# Patient Record
Sex: Female | Born: 1950 | Race: White | Hispanic: No | Smoking: Former smoker
Health system: Southern US, Community
[De-identification: ages and names within clinical notes are randomized; demographics above are authoritative.]

## PROBLEM LIST (undated history)

## (undated) DIAGNOSIS — M549 Dorsalgia, unspecified: Secondary | ICD-10-CM

## (undated) DIAGNOSIS — I2089 Other forms of angina pectoris: Secondary | ICD-10-CM

## (undated) DIAGNOSIS — C4491 Basal cell carcinoma of skin, unspecified: Secondary | ICD-10-CM

## (undated) DIAGNOSIS — C8590 Non-Hodgkin lymphoma, unspecified, unspecified site: Secondary | ICD-10-CM

## (undated) DIAGNOSIS — C8190 Hodgkin lymphoma, unspecified, unspecified site: Secondary | ICD-10-CM

## (undated) DIAGNOSIS — I10 Essential (primary) hypertension: Secondary | ICD-10-CM

## (undated) DIAGNOSIS — I208 Other forms of angina pectoris: Secondary | ICD-10-CM

## (undated) DIAGNOSIS — J449 Chronic obstructive pulmonary disease, unspecified: Secondary | ICD-10-CM

## (undated) HISTORY — DX: Basal cell carcinoma of skin, unspecified: C44.91

## (undated) HISTORY — PX: MOHS SURGERY: SUR867

## (undated) HISTORY — DX: Other forms of angina pectoris: I20.89

## (undated) HISTORY — DX: Chronic obstructive pulmonary disease, unspecified: J44.9

## (undated) HISTORY — DX: Other forms of angina pectoris: I20.8

---

## 1998-06-15 HISTORY — PX: PORTACATH PLACEMENT: SHX2246

## 2004-09-03 ENCOUNTER — Emergency Department: Payer: Self-pay | Admitting: Emergency Medicine

## 2005-01-27 ENCOUNTER — Ambulatory Visit: Payer: Self-pay | Admitting: Surgery

## 2005-02-04 ENCOUNTER — Ambulatory Visit: Payer: Self-pay | Admitting: Surgery

## 2007-02-22 ENCOUNTER — Emergency Department: Payer: Self-pay | Admitting: Emergency Medicine

## 2007-02-24 ENCOUNTER — Ambulatory Visit: Payer: Self-pay | Admitting: Urology

## 2007-02-28 ENCOUNTER — Ambulatory Visit: Payer: Self-pay | Admitting: Emergency Medicine

## 2007-03-04 ENCOUNTER — Ambulatory Visit: Payer: Self-pay | Admitting: Urology

## 2007-08-10 ENCOUNTER — Ambulatory Visit: Payer: Self-pay | Admitting: Internal Medicine

## 2014-11-26 DIAGNOSIS — M25462 Effusion, left knee: Secondary | ICD-10-CM | POA: Diagnosis not present

## 2014-11-26 DIAGNOSIS — M25562 Pain in left knee: Secondary | ICD-10-CM | POA: Diagnosis not present

## 2014-11-26 DIAGNOSIS — M79605 Pain in left leg: Secondary | ICD-10-CM | POA: Diagnosis not present

## 2014-12-12 DIAGNOSIS — M25761 Osteophyte, right knee: Secondary | ICD-10-CM | POA: Diagnosis not present

## 2014-12-12 DIAGNOSIS — M25562 Pain in left knee: Secondary | ICD-10-CM | POA: Diagnosis not present

## 2014-12-12 DIAGNOSIS — M1712 Unilateral primary osteoarthritis, left knee: Secondary | ICD-10-CM | POA: Diagnosis not present

## 2015-10-10 DIAGNOSIS — B029 Zoster without complications: Secondary | ICD-10-CM | POA: Diagnosis not present

## 2015-10-10 DIAGNOSIS — N390 Urinary tract infection, site not specified: Secondary | ICD-10-CM | POA: Diagnosis not present

## 2015-10-17 DIAGNOSIS — B029 Zoster without complications: Secondary | ICD-10-CM | POA: Diagnosis not present

## 2015-10-17 DIAGNOSIS — C44311 Basal cell carcinoma of skin of nose: Secondary | ICD-10-CM | POA: Diagnosis not present

## 2015-10-17 DIAGNOSIS — L821 Other seborrheic keratosis: Secondary | ICD-10-CM | POA: Diagnosis not present

## 2015-10-17 DIAGNOSIS — D485 Neoplasm of uncertain behavior of skin: Secondary | ICD-10-CM | POA: Diagnosis not present

## 2015-11-14 DIAGNOSIS — L814 Other melanin hyperpigmentation: Secondary | ICD-10-CM | POA: Diagnosis not present

## 2015-11-14 DIAGNOSIS — C44310 Basal cell carcinoma of skin of unspecified parts of face: Secondary | ICD-10-CM | POA: Insufficient documentation

## 2015-11-14 DIAGNOSIS — L578 Other skin changes due to chronic exposure to nonionizing radiation: Secondary | ICD-10-CM | POA: Diagnosis not present

## 2015-11-14 DIAGNOSIS — C44311 Basal cell carcinoma of skin of nose: Secondary | ICD-10-CM | POA: Diagnosis not present

## 2015-11-14 DIAGNOSIS — L908 Other atrophic disorders of skin: Secondary | ICD-10-CM | POA: Diagnosis not present

## 2015-11-14 DIAGNOSIS — C4491 Basal cell carcinoma of skin, unspecified: Secondary | ICD-10-CM

## 2015-11-14 HISTORY — DX: Basal cell carcinoma of skin, unspecified: C44.91

## 2016-02-25 DIAGNOSIS — Z85828 Personal history of other malignant neoplasm of skin: Secondary | ICD-10-CM | POA: Diagnosis not present

## 2016-02-25 DIAGNOSIS — L905 Scar conditions and fibrosis of skin: Secondary | ICD-10-CM | POA: Diagnosis not present

## 2016-06-11 ENCOUNTER — Observation Stay
Admission: EM | Admit: 2016-06-11 | Discharge: 2016-06-12 | Disposition: A | Discharge status: Home / Self Care | Payer: Medicare Other | Attending: Internal Medicine | Admitting: Internal Medicine

## 2016-06-11 ENCOUNTER — Ambulatory Visit (INDEPENDENT_AMBULATORY_CARE_PROVIDER_SITE_OTHER)
Admission: EM | Admit: 2016-06-11 | Discharge: 2016-06-11 | Disposition: A | Discharge status: Home / Self Care | Payer: Medicare Other | Source: Home / Self Care | Attending: Emergency Medicine | Admitting: Emergency Medicine

## 2016-06-11 ENCOUNTER — Encounter: Payer: Self-pay | Admitting: Emergency Medicine

## 2016-06-11 ENCOUNTER — Emergency Department: Payer: Medicare Other

## 2016-06-11 DIAGNOSIS — Z79899 Other long term (current) drug therapy: Secondary | ICD-10-CM | POA: Insufficient documentation

## 2016-06-11 DIAGNOSIS — Z87891 Personal history of nicotine dependence: Secondary | ICD-10-CM | POA: Diagnosis not present

## 2016-06-11 DIAGNOSIS — Z8572 Personal history of non-Hodgkin lymphomas: Secondary | ICD-10-CM | POA: Diagnosis not present

## 2016-06-11 DIAGNOSIS — Z85828 Personal history of other malignant neoplasm of skin: Secondary | ICD-10-CM | POA: Insufficient documentation

## 2016-06-11 DIAGNOSIS — R0609 Other forms of dyspnea: Secondary | ICD-10-CM

## 2016-06-11 DIAGNOSIS — Z8571 Personal history of Hodgkin lymphoma: Secondary | ICD-10-CM | POA: Insufficient documentation

## 2016-06-11 DIAGNOSIS — E669 Obesity, unspecified: Secondary | ICD-10-CM | POA: Insufficient documentation

## 2016-06-11 DIAGNOSIS — Z6841 Body Mass Index (BMI) 40.0 and over, adult: Secondary | ICD-10-CM | POA: Insufficient documentation

## 2016-06-11 DIAGNOSIS — I208 Other forms of angina pectoris: Principal | ICD-10-CM | POA: Insufficient documentation

## 2016-06-11 DIAGNOSIS — Z7982 Long term (current) use of aspirin: Secondary | ICD-10-CM | POA: Insufficient documentation

## 2016-06-11 DIAGNOSIS — M549 Dorsalgia, unspecified: Secondary | ICD-10-CM | POA: Diagnosis not present

## 2016-06-11 DIAGNOSIS — R0602 Shortness of breath: Secondary | ICD-10-CM

## 2016-06-11 DIAGNOSIS — R0600 Dyspnea, unspecified: Secondary | ICD-10-CM

## 2016-06-11 DIAGNOSIS — I200 Unstable angina: Secondary | ICD-10-CM | POA: Diagnosis not present

## 2016-06-11 HISTORY — DX: Hodgkin lymphoma, unspecified, unspecified site: C81.90

## 2016-06-11 HISTORY — DX: Non-Hodgkin lymphoma, unspecified, unspecified site: C85.90

## 2016-06-11 LAB — BASIC METABOLIC PANEL
ANION GAP: 7 (ref 5–15)
BUN: 9 mg/dL (ref 6–20)
CALCIUM: 9.4 mg/dL (ref 8.9–10.3)
CO2: 29 mmol/L (ref 22–32)
CREATININE: 0.82 mg/dL (ref 0.44–1.00)
Chloride: 102 mmol/L (ref 101–111)
GFR calc non Af Amer: >60 mL/min (ref >60)
Glucose, Bld: 121 mg/dL — ABNORMAL HIGH (ref 65–99)
Potassium: 3.6 mmol/L (ref 3.5–5.1)
SODIUM: 138 mmol/L (ref 135–145)

## 2016-06-11 LAB — CBC
HCT: 43.9 % (ref 35.0–47.0)
HEMOGLOBIN: 14.7 g/dL (ref 12.0–16.0)
MCH: 31 pg (ref 26.0–34.0)
MCHC: 33.4 g/dL (ref 32.0–36.0)
MCV: 92.7 fL (ref 80.0–100.0)
PLATELETS: 277 10*3/uL (ref 150–440)
RBC: 4.74 MIL/uL (ref 3.80–5.20)
RDW: 14.2 % (ref 11.5–14.5)
WBC: 10.9 10*3/uL (ref 3.6–11.0)

## 2016-06-11 LAB — TROPONIN I: Troponin I: <0.03 ng/mL (ref <0.03)

## 2016-06-11 MED ORDER — ONDANSETRON HCL 4 MG PO TABS: 4.0000 mg | ORAL_TABLET | Freq: Four times a day (QID) | ORAL | Status: DC | PRN

## 2016-06-11 MED ORDER — ASPIRIN 81 MG PO CHEW
324.0000 mg | CHEWABLE_TABLET | Freq: Once | ORAL | Status: AC
  Administered 2016-06-11: 324 mg via ORAL

## 2016-06-11 MED ORDER — ACETAMINOPHEN 650 MG RE SUPP: 650.0000 mg | Freq: Four times a day (QID) | RECTAL | Status: DC | PRN

## 2016-06-11 MED ORDER — SODIUM CHLORIDE 0.9% FLUSH: 3.0000 mL | INTRAVENOUS | Status: DC | PRN

## 2016-06-11 MED ORDER — SODIUM CHLORIDE 0.9% FLUSH
3.0000 mL | Freq: Two times a day (BID) | INTRAVENOUS | Status: DC
  Administered 2016-06-11 – 2016-06-12 (×3): 3 mL via INTRAVENOUS

## 2016-06-11 MED ORDER — ENOXAPARIN SODIUM 40 MG/0.4ML ~~LOC~~ SOLN
40.0000 mg | SUBCUTANEOUS | Status: DC
  Administered 2016-06-11 – 2016-06-12 (×2): 40 mg via SUBCUTANEOUS
  Filled 2016-06-11 (×2): qty 0.4

## 2016-06-11 MED ORDER — SODIUM CHLORIDE 0.9 % IV SOLN: 250.0000 mL | INTRAVENOUS | Status: DC | PRN

## 2016-06-11 MED ORDER — ONDANSETRON HCL 4 MG/2ML IJ SOLN: 4.0000 mg | Freq: Four times a day (QID) | INTRAMUSCULAR | Status: DC | PRN

## 2016-06-11 MED ORDER — ACETAMINOPHEN 325 MG PO TABS: 650.0000 mg | ORAL_TABLET | Freq: Four times a day (QID) | ORAL | Status: DC | PRN

## 2016-06-11 MED ORDER — NITROGLYCERIN 0.4 MG SL SUBL: 0.4000 mg | SUBLINGUAL_TABLET | SUBLINGUAL | Status: DC | PRN

## 2016-06-11 MED ORDER — ASPIRIN EC 81 MG PO TBEC
81.0000 mg | DELAYED_RELEASE_TABLET | Freq: Every day | ORAL | Status: DC
  Administered 2016-06-12: 81 mg via ORAL
  Filled 2016-06-11: qty 1

## 2016-06-11 MED ORDER — SODIUM CHLORIDE 0.9% FLUSH: 3.0000 mL | Freq: Two times a day (BID) | INTRAVENOUS | Status: DC

## 2016-06-11 NOTE — ED Notes (Signed)
ED Provider at bedside. 

## 2016-06-11 NOTE — ED Notes (Addendum)
Pt sent from [facility] for SOB and back pain, was told EKG changes and to be seen here for further eval. Pt denies any pain at this time, NAD noted. Pt states some back pain but has chronic back pain.  Pt A&Ox4.

## 2016-06-11 NOTE — H&P (Signed)
[place] [place] [facility] [place] NAME: Christina Nielsen    MR#:  [id number]  DATE OF BIRTH:  [birth year]  DATE OF ADMISSION:  06/11/2016  PRIMARY CARE PHYSICIAN: No PCP Per Patient   REQUESTING/REFERRING PHYSICIAN: Dr. Joni Fears  CHIEF COMPLAINT:   Chief Complaint  Patient presents with  . Back Pain  . Shortness of Breath    HISTORY OF PRESENT ILLNESS:  Christina Nielsen  is a 65 y.o. female with a known history of Hodgkin's/non-Hodgkin's lymphoma in remission, obesity, history of tobacco use presents to the emergency room complaining of worsening shortness of breath and vague aching of her left chest shoulder and neck with the past 2 days. Symptoms are exertional and improved with rest. Presently chest pain-free. She had some mild sinusitis and wheezing recently which has resolved. No prior stress test or cardiac catheterization. No family history of CAD. She did smoke in the past but quit 1 year back. Here in the emergency room patient's EKG shows no acute ST elevation or elevated troponin. Patient is being admitted for further workup of her unstable angina.  PAST MEDICAL HISTORY:   Past Medical History:  Diagnosis Date  . Hodgkin's lymphoma ([place])   . Non Hodgkin's lymphoma ([place])     PAST SURGICAL HISTORY:   Past Surgical History:  Procedure Laterality Date  . MOHS SURGERY    . PORTACATH PLACEMENT      SOCIAL HISTORY:   Social History  Substance Use Topics  . Smoking status: Former Smoker    Quit date: 05/21/2015  . Smokeless tobacco: Never Used  . Alcohol use No    FAMILY HISTORY:   Family History  Problem Relation Age of Onset  . Heart failure Father   . Atrial fibrillation Sister     DRUG ALLERGIES:  No Known Allergies  REVIEW OF SYSTEMS:   Review of Systems  Constitutional: Positive for malaise/fatigue. Negative for chills, fever and weight loss.  HENT: Negative for hearing loss and nosebleeds.   Eyes: Negative for blurred  vision, double vision and pain.  Respiratory: Positive for shortness of breath. Negative for cough, hemoptysis, sputum production and wheezing.   Cardiovascular: Positive for chest pain. Negative for palpitations, orthopnea and leg swelling.  Gastrointestinal: Negative for abdominal pain, constipation, diarrhea, nausea and vomiting.  Genitourinary: Negative for dysuria and hematuria.  Musculoskeletal: Negative for back pain, falls and myalgias.  Skin: Negative for rash.  Neurological: Negative for dizziness, tremors, sensory change, speech change, focal weakness, seizures and headaches.  Endo/Heme/Allergies: Does not bruise/bleed easily.  Psychiatric/Behavioral: Negative for depression and memory loss. The patient is not nervous/anxious.     MEDICATIONS AT HOME:   Prior to Admission medications   Medication Sig Start Date End Date Taking? Authorizing Provider  aspirin EC 81 MG tablet Take 81 mg by mouth daily.   Yes Historical Provider, MD     VITAL SIGNS:  Blood pressure 125/81, pulse 71, temperature 98.1 F (36.7 C), temperature source Oral, resp. rate 19, SpO2 100 %.  PHYSICAL EXAMINATION:  Physical Exam  GENERAL:  65 y.o.-year-old patient lying in the bed with no acute distress. Obese EYES: Pupils equal, round, reactive to light and accommodation. No scleral icterus. Extraocular muscles intact.  HEENT: Head atraumatic, normocephalic. Oropharynx and nasopharynx clear. No oropharyngeal erythema, moist oral mucosa  NECK:  Supple, no jugular venous distention. No thyroid enlargement, no tenderness.  LUNGS: Normal breath sounds bilaterally, no wheezing, rales, rhonchi. No use of accessory muscles of  respiration.  CARDIOVASCULAR: S1, S2 normal. No murmurs, rubs, or gallops.  ABDOMEN: Soft, nontender, nondistended. Bowel sounds present. No organomegaly or mass.  EXTREMITIES: No pedal edema, cyanosis, or clubbing. + 2 pedal & radial pulses b/l.   NEUROLOGIC: Cranial nerves II through  XII are intact. No focal Motor or sensory deficits appreciated b/l PSYCHIATRIC: The patient is alert and oriented x 3. Good affect.  SKIN: No obvious rash, lesion, or ulcer.   LABORATORY PANEL:   CBC  Recent Labs Lab 06/11/16 1156  WBC 10.9  HGB 14.7  HCT 43.9  PLT 277   ------------------------------------------------------------------------------------------------------------------  Chemistries   Recent Labs Lab 06/11/16 1156  NA 138  K 3.6  CL 102  CO2 29  GLUCOSE 121*  BUN 9  CREATININE 0.82  CALCIUM 9.4   ------------------------------------------------------------------------------------------------------------------  Cardiac Enzymes  Recent Labs Lab 06/11/16 1156  TROPONINI <0.03   ------------------------------------------------------------------------------------------------------------------  RADIOLOGY:  Dg Chest 2 View  Result Date: 06/11/2016 CLINICAL DATA:  Shortness of Breath, progressing. Abnormal electrocardiogram. History of lymphoma EXAM: CHEST  2 VIEW COMPARISON:  August 10, 2007 FINDINGS: There is mild scarring in the left base. Lungs elsewhere are clear. Heart size and pulmonary vascularity are normal. Central catheter tip is in the superior vena cava. No pneumothorax. No evident adenopathy. There is atherosclerotic calcification in the aorta. There is degenerative change in the thoracic spine. IMPRESSION: Mild scarring left base. No edema or consolidation. Aortic atherosclerosis. No evident adenopathy. Electronically Signed   By: Lowella Grip III M.D.   On: 06/11/2016 12:35     IMPRESSION AND PLAN:   * Unstable angina Typical symptoms with Exertional left chest, back and neck aching at shortness of breath. This improves with rest. Her initial troponin and EKG showed nothing acute. The patient will need further workup and will be admitted under observation. Discussed case with Dr. Darrow Bussing of cardiology for consideration of cardiac  catheterization. I have placed orders for nothing by mouth after midnight. Start aspirin. Check HbA1c and fasting lipid profile.  DVT prophylaxis with Lovenox.  All the records are reviewed and case discussed with ED provider. Management plans discussed with the patient, family and they are in agreement.  CODE STATUS: FULL CODE  TOTAL TIME TAKING CARE OF THIS PATIENT: 40 minutes.   Hillary Bow R M.D on 06/11/2016 at 3:19 PM  Between 7am to 6pm - Pager - [phone]  After 6pm go to [email or web address] - password [email or web address] [facility]  Office  [phone]  CC: Primary care physician; No PCP Per Patient  Note: This dictation was prepared with Dragon dictation along with smaller phrase technology. Any transcriptional errors that result from this process are unintentional.

## 2016-06-11 NOTE — ED Provider Notes (Signed)
[facility] ____________________________________________  Time seen: Approximately [id number] am  I have reviewed the triage vital signs and the nursing notes.   HISTORY  Chief Complaint Shortness of Breath   HPI Christina Nielsen is a 65 y.o. female present for the complaints of shortness of breath. Patient reports she has had some exertional shortness of breath for the last several weeks but reports increase of the shortness of breath in the last 2 days. Also reports over last 3 days she has had some left-sided back pain, but does not necessarily change with position or movement. Patient also reports some left-sided posterior neck pain. Patient reports the last 2 days shortness of breath has been primarily with exertion, and states present whenever walking more than 5 feet. Denies history of similar in the past. Denies recent cough, congestion or fevers.  Denies chest pain or chest pain with deep breath. Denies any leg pain or atypical extremity swelling. Denies fall, injury or trauma. Denies heavy lifting. Patient reports that she does frequently sit for long periods of time when writing with her husband who is a Administrator. Patient reports that she does not currently have a primary physician and does not follow up regularly.     Past Medical History:  Diagnosis Date  . Hodgkin's lymphoma ([place])   . Non Hodgkin's lymphoma ([place])   Basal cell carcinoma to nose   There are no active problems to display for this patient.   Past Surgical History:  Procedure Laterality Date  . MOHS SURGERY    . PORTACATH PLACEMENT      Current Outpatient Rx  . Order #: [id number] Class: Historical Med    No current facility-administered medications for this encounter.   Current Outpatient Prescriptions:  .  aspirin EC 81 MG tablet, Take 81 mg by mouth daily., Disp: , Rfl:   Allergies Patient has no known allergies.   family history Father : Congestive heart failure   mother and  sister: Asthma Brother and sister: Atrial fibrillation  Social History Social History  Substance Use Topics  . Smoking status: Former Smoker    Quit date: 05/21/2015  . Smokeless tobacco: Never Used  . Alcohol use No    Review of Systems Constitutional: No fever/chills Eyes: No visual changes. ENT: No sore throat. Cardiovascular: Denies chest pain. Respiratory: Positive shortness of breath  Gastrointestinal: No abdominal pain.  No nausea, no vomiting.  No diarrhea.  No constipation. Genitourinary: Negative for dysuria. Musculoskeletal: Negative for back pain. Skin: Negative for rash. Neurological: Negative for headaches, focal weakness or numbness.  10-point ROS otherwise negative.  ____________________________________________   PHYSICAL EXAM:  VITAL SIGNS: ED Triage Vitals  Enc Vitals Group     BP 06/11/16 1026 (!) 157/77     Pulse Rate 06/11/16 1026 79     Resp 06/11/16 1026 17     Temp 06/11/16 1026 98 F (36.7 C)     Temp Source 06/11/16 1026 Oral     SpO2 06/11/16 1026 97 %     Weight 06/11/16 1026 270 lb (122.5 kg)     Height 06/11/16 1026 5\' 2"  (1.575 m)     Head Circumference --      Peak Flow --      Pain Score 06/11/16 1029 0     Pain Loc --      Pain Edu? --      Excl. in [place]? --     Constitutional: Alert and oriented. Well appearing and in no  acute distress. Eyes: Conjunctivae are normal. PERRL. EOMI. ENT      Head: Normocephalic and atraumatic.      Nose: No congestion/rhinnorhea.      Mouth/Throat: Mucous membranes are moist.Oropharynx non-erythematous.No tonsillar swelling or exudate.  Neck: No stridor. Supple without meningismus.  Hematological/Lymphatic/Immunilogical: No cervical lymphadenopathy. Cardiovascular: Normal rate, regular rhythm. Grossly normal heart sounds.  Good peripheral circulation. Respiratory: Normal respiratory effort without tachypnea nor retractions. Breath sounds are clear and equal bilaterally. No  wheezes/rales/rhonchi.. Gastrointestinal: Soft and nontender. Obese abdomen. No CVA tenderness. Musculoskeletal:Ambulatory with steady gait. No midline cervical, thoracic or lumbar tenderness to palpation. Bilateral pedal pulses equal and easily palpated. no back tenderness to palpation or movement or overhead stretching.  Neurologic:  Normal speech and language.  Speech is normal. No gait instability.  Skin:  Skin is warm, dry and intact. No rash noted. Psychiatric: Mood and affect are normal. Speech and behavior are normal. Patient exhibits appropriate insight and judgment   ___________________________________________   LABS (all labs ordered are listed, but only abnormal results are displayed)  Labs Reviewed - No data to display ____________________________________________  EKG ED ECG REPORT I, Marylene Land, the attending provider and Dr Alphonzo Cruise, personally viewed and interpreted this ECG.   Date: 06/11/2016  EKG Time: 1038    Rate: 71  Rhythm:  sinus rhythm, no previous EKG available for comparison  Intervals:right bundle branch block  ST&T Change: Concern for ST elevation in lead 3 and aVF, no other ST depression or elevation noted.  ____________________________________________  RADIOLOGY  Dg Chest 2 View  Result Date: 06/11/2016 CLINICAL DATA:  Shortness of Breath, progressing. Abnormal electrocardiogram. History of lymphoma EXAM: CHEST  2 VIEW COMPARISON:  August 10, 2007 FINDINGS: There is mild scarring in the left base. Lungs elsewhere are clear. Heart size and pulmonary vascularity are normal. Central catheter tip is in the superior vena cava. No pneumothorax. No evident adenopathy. There is atherosclerotic calcification in the aorta. There is degenerative change in the thoracic spine. IMPRESSION: Mild scarring left base. No edema or consolidation. Aortic atherosclerosis. No evident adenopathy. Electronically Signed   By: Lowella Grip III M.D.   On: 06/11/2016  12:35   ____________________________________________   PROCEDURES Procedures     INITIAL IMPRESSION / ASSESSMENT AND PLAN / ED COURSE  Pertinent labs & imaging results that were available during my care of the patient were reviewed by me and considered in my medical decision making (see chart for details).  Presenting for the complaints of exertional shortness of breath that has acutely worsened over the last 2 days. Patient also reports some left sided posterior back pain which is now increased reproducible by exam or movement. EKG reviewed, no previous EKG available for comparison. Discussed in detail with patient concern of possible EKG changes. Denies chest pain. However discussed in detail with patient concern for cardiac etiology. 324 mg aspirin given once. Patient does also report former smoker, positive cardiac family history and frequent sitting for long periods of time. Recommend patient be seen in further evaluated emergency room at this time for concern of cardiac etiology. Patient alert and oriented with decisional capacity, and states that her husband will drive her directly to the emergency room. Patient declines EMS transfer. Patient states that they will be going to [place] RN, charge nurse called and given report. Patient stable at time of discharge.  Discussed follow up with Primary care physician this week. Discussed follow up and return parameters including no resolution or  any worsening concerns. Patient verbalized understanding and agreed to plan.   ____________________________________________   FINAL CLINICAL IMPRESSION(S) / ED DIAGNOSES  Final diagnoses:  SOB (shortness of breath)  Left-sided back pain, unspecified back location, unspecified chronicity     Discharge Medication List as of 06/11/2016 11:16 AM      Note: This dictation was prepared with Dragon dictation along with smaller phrase technology. Any transcriptional errors that  result from this process are unintentional.    Clinical Course       Marylene Land, NP 06/11/16 1525

## 2016-06-11 NOTE — ED Provider Notes (Signed)
[facility] Emergency Department Provider Note  ____________________________________________  Time seen: Approximately 2:45 PM  I have reviewed the triage vital signs and the nursing notes.   HISTORY  Chief Complaint Back Pain and Shortness of Breath    HPI Christina Nielsen is a 65 y.o. female sent to the ED from urgent care due to dyspnea on exertion. She states this is been on for the past 2-3 weeks, but much more pronounced over the past few days. Associated with upper back pain and a heaviness feeling in her left shoulder and left jaw. No vomiting or diaphoresis. Never had anything like this before. Medical history is positive for Hodgkin's lymphoma, non-Hodgkin's lymphoma, and basal cell carcinoma. Denies any history of hypertension and hyperlipidemia or diabetes or prior heart disease. She is a former smoker. Symptoms are intermittent and only with exertion. Not pleuritic. Currently resolved at rest.     Past Medical History:  Diagnosis Date  . Hodgkin's lymphoma ([place])   . Non Hodgkin's lymphoma ([place])      There are no active problems to display for this patient.    Past Surgical History:  Procedure Laterality Date  . MOHS SURGERY    . PORTACATH PLACEMENT       Prior to Admission medications   Medication Sig Start Date End Date Taking? Authorizing Provider  aspirin EC 81 MG tablet Take 81 mg by mouth daily.   Yes Historical Provider, MD     Allergies Patient has no known allergies.   No family history on file.  Social History Social History  Substance Use Topics  . Smoking status: Former Smoker    Quit date: 05/21/2015  . Smokeless tobacco: Never Used  . Alcohol use No    Review of Systems  Constitutional:   No fever or chills.  ENT:   No sore throat. No rhinorrhea. Cardiovascular:   Positive as above posterior chest pain. Respiratory:  Positive shortness of breath without cough. Gastrointestinal:   Negative for abdominal  pain, vomiting and diarrhea.  Genitourinary:   Negative for dysuria or difficulty urinating. Musculoskeletal:   Negative for focal pain or swelling Neurological:   Negative for headaches 10-point ROS otherwise negative.  ____________________________________________   PHYSICAL EXAM:  VITAL SIGNS: ED Triage Vitals  Enc Vitals Group     BP 06/11/16 1149 (!) 153/73     Pulse Rate 06/11/16 1149 67     Resp 06/11/16 1149 20     Temp 06/11/16 1149 98.1 F (36.7 C)     Temp Source 06/11/16 1149 Oral     SpO2 06/11/16 1149 96 %     Weight --      Height --      Head Circumference --      Peak Flow --      Pain Score 06/11/16 1320 0     Pain Loc --      Pain Edu? --      Excl. in [place]? --     Vital signs reviewed, nursing assessments reviewed.   Constitutional:   Alert and oriented. Well appearing and in no distress. Eyes:   No scleral icterus. No conjunctival pallor. PERRL. EOMI.  No nystagmus. ENT   Head:   Normocephalic and atraumatic.   Nose:   No congestion/rhinnorhea. No septal hematoma   Mouth/Throat:   MMM, no pharyngeal erythema. No peritonsillar mass.    Neck:   No stridor. No SubQ emphysema. No meningismus. Hematological/Lymphatic/Immunilogical:   No cervical  lymphadenopathy. Cardiovascular:   RRR. Symmetric bilateral radial and DP pulses.  No murmurs.  Respiratory:   Normal respiratory effort without tachypnea nor retractions. Breath sounds are clear and equal bilaterally. No wheezes/rales/rhonchi. Chest wall nontender Gastrointestinal:   Soft and nontender. Non distended. There is no CVA tenderness.  No rebound, rigidity, or guarding. Genitourinary:   deferred Musculoskeletal:   Nontender with normal range of motion in all extremities. No joint effusions.  No lower extremity tenderness.  Trace pitting edema.  No midline spinal tenderness or paraspinous musculature tenderness Neurologic:   Normal speech and language.  CN 2-10 normal. Motor grossly  intact. No gross focal neurologic deficits are appreciated.  Skin:    Skin is warm, dry and intact. No rash noted.  No petechiae, purpura, or bullae.  ____________________________________________    LABS (pertinent positives/negatives) (all labs ordered are listed, but only abnormal results are displayed) Labs Reviewed  BASIC METABOLIC PANEL - Abnormal; Notable for the following:       Result Value   Glucose, Bld 121 (*)    All other components within normal limits  CBC  TROPONIN I   ____________________________________________   EKG  Interpreted by me Sinus rhythm rate of 71, left axis, normal intervals. Right bundle-branch block. LVH in the high lateral leads by voltage criteria.  ____________________________________________    RADIOLOGY  Chest x-ray unremarkable  ____________________________________________   PROCEDURES Procedures  ____________________________________________   INITIAL IMPRESSION / ASSESSMENT AND PLAN / ED COURSE  Pertinent labs & imaging results that were available during my care of the patient were reviewed by me and considered in my medical decision making (see chart for details).  Patient is not in distress, presents with exertional symptoms predominantly shortness of breath associated with some pain in various areas that could be referred from cardiac pathology. Her symptoms are concerning for angina, although she does not currently have any pain or shortness of breath at rest, not unstable angina or ACS at present time. [place] further investigation in the hospital for further risk stratification. Case discussed with the hospitalist. Low suspicion for dissection or PE pericarditis pneumonia or sepsis. No evidence of pneumothorax.     Clinical Course    ____________________________________________   FINAL CLINICAL IMPRESSION(S) / ED DIAGNOSES  Final diagnoses:  Acute upper back pain  Dyspnea on exertion      New Prescriptions    No medications on file     Portions of this note were generated with dragon dictation software. Dictation errors may occur despite best attempts at proofreading.    Carrie Mew, MD 06/11/16 [phone]

## 2016-06-11 NOTE — ED Triage Notes (Signed)
Pt sent over from [facility] for further eval of upper back pain in between shoulder blades started yesterday with shortness of breath.

## 2016-06-11 NOTE — ED Triage Notes (Signed)
Patient complains of shortness of breath that worsens when she exerts herself. Patient states that she has felt a heaviness in her left shoulder yesterday. Patient states that she has also been having some back pain. Patient states that she was trying to attribute symptoms to old age. Patient states that the slightest movement (burping) cause pain in her back. Patient states that she started to worry when symptoms worsened today.

## 2016-06-11 NOTE — Progress Notes (Signed)
Christina Nielsen is a 65 y.o. female  [id number]  Primary Cardiologist: Thedore Mins Reason for Consultation:shortness of breath and back pain  HPI: 32 YOWF presented to [facility] with back pain and shortnes of breath with exertion and no chest pain.   Review of Systems: No chesst pain,aorthopnea, or PND   Past Medical History:  Diagnosis Date  . Hodgkin's lymphoma ([place])   . Non Hodgkin's lymphoma ([place])     Medications Prior to Admission  Medication Sig Dispense Refill  . aspirin EC 81 MG tablet Take 81 mg by mouth daily.       Derrill Memo ON 06/12/2016] aspirin EC  81 mg Oral Daily  . enoxaparin (LOVENOX) injection  40 mg Subcutaneous Q24H  . sodium chloride flush  3 mL Intravenous Q12H    Infusions:   No Known Allergies  Social History   Social History  . Marital status: Married    Spouse name: N/A  . Number of children: N/A  . Years of education: N/A   Occupational History  . Not on file.   Social History Main Topics  . Smoking status: Former Smoker    Quit date: 05/21/2015  . Smokeless tobacco: Never Used  . Alcohol use No  . Drug use: No  . Sexual activity: Not on file   Other Topics Concern  . Not on file   Social History Narrative  . No narrative on file    Family History  Problem Relation Age of Onset  . Heart failure Father   . Atrial fibrillation Sister     PHYSICAL EXAM: Vitals:   06/11/16 1500 06/11/16 1639  BP: 125/81 (!) 130/47  Pulse: 71 79  Resp: 19 19  Temp:  97.9 F (36.6 C)    No intake or output data in the 24 hours ending 06/11/16 1851  General:  Well appearing. No respiratory difficulty HEENT: normal Neck: supple. no JVD. Carotids 2+ bilat; no bruits. No lymphadenopathy or thryomegaly appreciated. Cor: PMI nondisplaced. Regular rate & rhythm. No rubs, gallops or murmurs. Lungs: clear Abdomen: soft, nontender, nondistended. No hepatosplenomegaly. No bruits or masses. Good bowel sounds. Extremities: no cyanosis, clubbing,  rash, edema Neuro: alert & oriented x 3, cranial nerves grossly intact. moves all 4 extremities w/o difficulty. Affect pleasant.  ECG: NSR RBBB, LAHB, no acute changes  Results for orders placed or performed during the hospital encounter of 06/11/16 (from the past 24 hour(s))  Basic metabolic panel     Status: Abnormal   Collection Time: 06/11/16 11:56 AM  Result Value Ref Range   Sodium 138 135 - 145 mmol/L   Potassium 3.6 3.5 - 5.1 mmol/L   Chloride 102 101 - 111 mmol/L   CO2 29 22 - 32 mmol/L   Glucose, Bld 121 (H) 65 - 99 mg/dL   BUN 9 6 - 20 mg/dL   Creatinine, Ser 0.82 0.44 - 1.00 mg/dL   Calcium 9.4 8.9 - 10.3 mg/dL   GFR calc non Af Amer >60 >60 mL/min   GFR calc Af Amer >60 >60 mL/min   Anion gap 7 5 - 15  CBC     Status: None   Collection Time: 06/11/16 11:56 AM  Result Value Ref Range   WBC 10.9 3.6 - 11.0 K/uL   RBC 4.74 3.80 - 5.20 MIL/uL   Hemoglobin 14.7 12.0 - 16.0 g/dL   HCT 43.9 35.0 - 47.0 %   MCV 92.7 80.0 - 100.0 fL   MCH 31.0  26.0 - 34.0 pg   MCHC 33.4 32.0 - 36.0 g/dL   RDW 14.2 11.5 - 14.5 %   Platelets 277 150 - 440 K/uL  Troponin I     Status: None   Collection Time: 06/11/16 11:56 AM  Result Value Ref Range   Troponin I <0.03 <0.03 ng/mL  Troponin I     Status: None   Collection Time: 06/11/16  5:52 PM  Result Value Ref Range   Troponin I <0.03 <0.03 ng/mL   Dg Chest 2 View  Result Date: 06/11/2016 CLINICAL DATA:  Shortness of Breath, progressing. Abnormal electrocardiogram. History of lymphoma EXAM: CHEST  2 VIEW COMPARISON:  August 10, 2007 FINDINGS: There is mild scarring in the left base. Lungs elsewhere are clear. Heart size and pulmonary vascularity are normal. Central catheter tip is in the superior vena cava. No pneumothorax. No evident adenopathy. There is atherosclerotic calcification in the aorta. There is degenerative change in the thoracic spine. IMPRESSION: Mild scarring left base. No edema or consolidation. Aortic  atherosclerosis. No evident adenopathy. Electronically Signed   By: Lowella Grip III M.D.   On: 06/11/2016 12:35     ASSESSMENT AND PLAN: Atypical symptoms with possible CAD as has risk factors, but has DOE and back, pain. EKG and troponins are negative.Will need out patient w/u after MI is ruled out.  Christina Nielsen A

## 2016-06-12 ENCOUNTER — Observation Stay: Payer: Medicare Other

## 2016-06-12 ENCOUNTER — Telehealth: Payer: Self-pay | Admitting: General Practice

## 2016-06-12 DIAGNOSIS — R0602 Shortness of breath: Secondary | ICD-10-CM | POA: Diagnosis not present

## 2016-06-12 DIAGNOSIS — J209 Acute bronchitis, unspecified: Secondary | ICD-10-CM | POA: Diagnosis not present

## 2016-06-12 DIAGNOSIS — I200 Unstable angina: Secondary | ICD-10-CM | POA: Diagnosis not present

## 2016-06-12 DIAGNOSIS — R091 Pleurisy: Secondary | ICD-10-CM | POA: Diagnosis not present

## 2016-06-12 LAB — LIPID PANEL
CHOL/HDL RATIO: 2.8 ratio
Cholesterol: 141 mg/dL (ref 0–200)
HDL: 50 mg/dL (ref >40)
LDL Cholesterol: 68 mg/dL (ref 0–99)
Triglycerides: 113 mg/dL (ref <150)
VLDL: 23 mg/dL (ref 0–40)

## 2016-06-12 LAB — HEMOGLOBIN A1C
Hgb A1c MFr Bld: 5.5 % (ref 4.8–5.6)
MEAN PLASMA GLUCOSE: 111 mg/dL

## 2016-06-12 LAB — TROPONIN I: Troponin I: <0.03 ng/mL (ref <0.03)

## 2016-06-12 MED ORDER — LEVOFLOXACIN 750 MG PO TABS: 750.0000 mg | ORAL_TABLET | Freq: Every day | ORAL | 0 refills | Status: DC

## 2016-06-12 MED ORDER — IOPAMIDOL (ISOVUE-370) INJECTION 76%
75.0000 mL | Freq: Once | INTRAVENOUS | Status: AC | PRN
  Administered 2016-06-12: 75 mL via INTRAVENOUS

## 2016-06-12 MED ORDER — IBUPROFEN 600 MG PO TABS: 600.0000 mg | ORAL_TABLET | Freq: Four times a day (QID) | ORAL | 0 refills | Status: DC | PRN

## 2016-06-12 NOTE — Progress Notes (Signed)
Patient discharged per MD order and hospital protocol. Patient verbalized understanding of medications, discharge instructions and follow up appointments. Patient escorted off the unit via wheelchair by staff.

## 2016-06-12 NOTE — Telephone Encounter (Signed)
Patient has no PCP, Needs to establish with a new provider, either [place], [place] or [place]. Please advise, thanks

## 2016-06-12 NOTE — Progress Notes (Signed)
SUBJECTIVE: Patient denies any chest pain and is feeling much better with no back pain.   Vitals:   06/11/16 1639 06/11/16 2004 06/12/16 0412 06/12/16 0829  BP: (!) 130/47 140/62 (!) 130/47 124/74  Pulse: 79 81 70 80  Resp: 19 16 16 18   Temp: 97.9 F (36.6 C) 99.3 F (37.4 C) 98.3 F (36.8 C) 98.7 F (37.1 C)  TempSrc: Oral Oral  Oral  SpO2: 98% 95% 99% 94%  Weight: 271 lb 6.4 oz (123.1 kg)     Height: 5\' 2"  (1.575 m)       Intake/Output Summary (Last 24 hours) at 06/12/16 0847 Last data filed at 06/12/16 0411  Gross per 24 hour  Intake              360 ml  Output              550 ml  Net             -190 ml    LABS: Basic Metabolic Panel:  Recent Labs  06/11/16 1156  NA 138  K 3.6  CL 102  CO2 29  GLUCOSE 121*  BUN 9  CREATININE 0.82  CALCIUM 9.4   Liver Function Tests: No results for input(s): AST, ALT, ALKPHOS, BILITOT, PROT, ALBUMIN in the last 72 hours. No results for input(s): LIPASE, AMYLASE in the last 72 hours. CBC:  Recent Labs  06/11/16 1156  WBC 10.9  HGB 14.7  HCT 43.9  MCV 92.7  PLT 277   Cardiac Enzymes:  Recent Labs  06/11/16 1156 06/11/16 1752 06/12/16 0009  TROPONINI <0.03 <0.03 <0.03   BNP: Invalid input(s): POCBNP D-Dimer: No results for input(s): DDIMER in the last 72 hours. Hemoglobin A1C:  Recent Labs  06/11/16 1156  HGBA1C 5.5   Fasting Lipid Panel:  Recent Labs  06/12/16 0009  CHOL 141  HDL 50  LDLCALC 68  TRIG 113  CHOLHDL 2.8   Thyroid Function Tests: No results for input(s): TSH, T4TOTAL, T3FREE, THYROIDAB in the last 72 hours.  Invalid input(s): FREET3 Anemia Panel: No results for input(s): VITAMINB12, FOLATE, FERRITIN, TIBC, IRON, RETICCTPCT in the last 72 hours.   PHYSICAL EXAM General: Well developed, well nourished, in no acute distress HEENT:  Normocephalic and atramatic Neck:  No JVD.  Lungs: Clear bilaterally to auscultation and percussion. Heart: HRRR . Normal S1 and S2 without  gallops or murmurs.  Abdomen: Bowel sounds are positive, abdomen soft and non-tender  Msk:  Back normal, normal gait. Normal strength and tone for age. Extremities: No clubbing, cyanosis or edema.   Neuro: Alert and oriented X 3. Psych:  Good affect, responds appropriately  TELEMETRY: Sinus rhythm  ASSESSMENT AND PLAN: Atypical present patient for possible coronary artery disease with back pain and shortness of breath on exertion but has ruled out for myocardial infarction and has no significant EKG changes. Patient can be discharged on aspirin with follow-up 9:00 on Tuesday and will do outpatient echo and a stress test. ID showed the patient and she will come back to me if she has any problem until Tuesday and office card was given to the patient.  Active Problems:   Angina at rest [facility])    Dionisio David, MD, [facility] 06/12/2016 8:47 AM

## 2016-06-12 NOTE — Telephone Encounter (Signed)
Delphina from [facility] called to set up an HFU - angina at rest. Within the next 2 weeks. Please call pt at home to schedule. Please advise, thank you!

## 2016-06-12 NOTE — Discharge Instructions (Signed)
Shortness of Breath  Shortness of breath means you have trouble breathing. Shortness of breath needs medical care right away.  HOME CARE   ? Do not smoke.  ? Avoid being around chemicals or things (paint fumes, dust) that may bother your breathing.  ? Rest as needed. Slowly begin your normal activities.  ? Only take medicines as told by your doctor.  ? Keep all doctor visits as told.  GET HELP RIGHT AWAY IF:   ? Your shortness of breath gets worse.  ? You feel lightheaded, pass out (faint), or have a cough that is not helped by medicine.  ? You cough up blood.  ? You have pain with breathing.  ? You have pain in your chest, arms, shoulders, or belly (abdomen).  ? You have a fever.  ? You cannot walk up stairs or exercise the way you normally do.  ? You do not get better in the time expected.  ? You have a hard time doing normal activities even with rest.  ? You have problems with your medicines.  ? You have any new symptoms.  MAKE SURE YOU:  ? Understand these instructions.  ? Will watch your condition.  ? Will get help right away if you are not doing well or get worse.  This information is not intended to replace advice given to you by your health care provider. Make sure you discuss any questions you have with your health care provider.  Document Released: 11/18/2007 Document Revised: 06/06/2013 Document Reviewed: 08/17/2011  Elsevier Interactive Patient Education ? 2017 Elsevier Inc[employer]

## 2016-06-13 NOTE — Discharge Summary (Signed)
[place] [facility] [place] NAME: Christina Nielsen    MR#:  [id number]  DATE OF BIRTH:  1950/12/10  DATE OF ADMISSION:  06/11/2016   ADMITTING PHYSICIAN: Hillary Bow, MD  DATE OF DISCHARGE: 06/12/2016  5:22 PM  PRIMARY CARE PHYSICIAN: Crecencio Mc, MD   ADMISSION DIAGNOSIS:  Dyspnea on exertion [R06.09] Acute upper back pain [M54.9] DISCHARGE DIAGNOSIS:  Active Problems:   Angina at rest [facility])  SECONDARY DIAGNOSIS:   Past Medical History:  Diagnosis Date  . Hodgkin's lymphoma ([place])   . Non Hodgkin's lymphoma [facility])    HOSPITAL COURSE:  65 y.o. female with a known history of Hodgkin's/non-Hodgkin's lymphoma in remission, obesity, history of tobacco use admitted for worsening shortness of breath and vague aching of her left chest shoulder and neck with the past 2 days.  She was ruled out serial neg troponins and was seen by cardio recommending outpt cardiac work up. But due to ongoing symptoms of SOB and h/o lymphoma CT chest was performed which showed some haziness on LLL - will treat as possible PNA with abx, big pulmo vessels were not showing PE but due to poor contrast timing it was difficult to r/o small vessel clots.  Other possible diagnosis - pulmonary infarct, underlying malignancy so recommend repeat CT in 3-4 weeks for further eval. She may need cardiac work up as well including echo, stress.Marland Kitchen DISCHARGE CONDITIONS:  STABLE CONSULTS OBTAINED:  Treatment Team:  Dionisio David, MD DRUG ALLERGIES:  No Known Allergies DISCHARGE MEDICATIONS:   Allergies as of 06/12/2016   No Known Allergies     Medication List    TAKE these medications   aspirin EC 81 MG tablet Take 81 mg by mouth daily.   ibuprofen 600 MG tablet Commonly known as:  ADVIL,MOTRIN Take 1 tablet (600 mg total) by mouth every 6 (six) hours as needed for moderate pain.   levofloxacin 750 MG tablet Commonly known as:  LEVAQUIN Take 1 tablet (750 mg total)  by mouth daily.        DISCHARGE INSTRUCTIONS:   DIET:  Cardiac diet DISCHARGE CONDITION:  Good ACTIVITY:  Activity as tolerated OXYGEN:  Home Oxygen: No.  Oxygen Delivery: room air DISCHARGE LOCATION:  home   If you experience worsening of your admission symptoms, develop shortness of breath, life threatening emergency, suicidal or homicidal thoughts you must seek medical attention immediately by calling 911 or calling your MD immediately  if symptoms less severe.  You Must read complete instructions/literature along with all the possible adverse reactions/side effects for all the Medicines you take and that have been prescribed to you. Take any new Medicines after you have completely understood and accpet all the possible adverse reactions/side effects.   Please note  You were cared for by a hospitalist during your hospital stay. If you have any questions about your discharge medications or the care you received while you were in the hospital after you are discharged, you can call the unit and asked to speak with the hospitalist on call if the hospitalist that took care of you is not available. Once you are discharged, your primary care physician will handle any further medical issues. Please note that NO REFILLS for any discharge medications will be authorized once you are discharged, as it is imperative that you return to your primary care physician (or establish a relationship with a primary care physician if you do not have one) for your aftercare needs  so that they can reassess your need for medications and monitor your lab values.    On the day of Discharge:  VITAL SIGNS:  Blood pressure 137/61, pulse 84, temperature 98 F (36.7 C), temperature source Oral, resp. rate 18, height 5\' 2"  (1.575 m), weight 123.1 kg (271 lb 6.4 oz), SpO2 97 %. PHYSICAL EXAMINATION:  GENERAL:  65 y.o.-year-old patient lying in the bed with no acute distress.  EYES: Pupils equal, round, reactive  to light and accommodation. No scleral icterus. Extraocular muscles intact.  HEENT: Head atraumatic, normocephalic. Oropharynx and nasopharynx clear.  NECK:  Supple, no jugular venous distention. No thyroid enlargement, no tenderness.  LUNGS: Normal breath sounds bilaterally, no wheezing, rales,rhonchi or crepitation. No use of accessory muscles of respiration.  CARDIOVASCULAR: S1, S2 normal. No murmurs, rubs, or gallops.  ABDOMEN: Soft, non-tender, non-distended. Bowel sounds present. No organomegaly or mass.  EXTREMITIES: No pedal edema, cyanosis, or clubbing.  NEUROLOGIC: Cranial nerves II through XII are intact. Muscle strength 5/5 in all extremities. Sensation intact. Gait not checked.  PSYCHIATRIC: The patient is alert and oriented x 3.  SKIN: No obvious rash, lesion, or ulcer.  DATA REVIEW:   CBC  Recent Labs Lab 06/11/16 1156  WBC 10.9  HGB 14.7  HCT 43.9  PLT 277    Chemistries   Recent Labs Lab 06/11/16 1156  NA 138  K 3.6  CL 102  CO2 29  GLUCOSE 121*  BUN 9  CREATININE 0.82  CALCIUM 9.4    Follow-up Information    TULLO, TERESA L, MD. Schedule an appointment as soon as possible for a visit in 1 week(s).   Specialty:  Internal Medicine Contact information: [place] [address] [phone]        Ida Rogue, MD. Schedule an appointment as soon as possible for a visit on 06/16/2016.   Specialty:  Cardiology Why:  @ 2 pm at [employer] information: [place] [phone]           RADIOLOGY:  Ct Angio Chest Pe W Or Wo Contrast  Result Date: 06/12/2016 CLINICAL DATA:  66 year old female with progressive shortness of breath for 2-3 weeks. Upper back pain, left shoulder and jaw pain. Personal history of lymphoma. Initial encounter. EXAM: CT ANGIOGRAPHY CHEST WITH CONTRAST TECHNIQUE: Multidetector CT imaging of the chest was performed using the standard  protocol during bolus administration of intravenous contrast. Multiplanar CT image reconstructions and MIPs were obtained to evaluate the vascular anatomy. CONTRAST:  75 mL Isovue 370 COMPARISON:  Noncontrast CT Abdomen and Pelvis 02/22/2007. Chest radiographs from 1214 hours today and earlier. FINDINGS: Cardiovascular: Suboptimal contrast bolus timing in the pulmonary arterial tree. Mild superimposed respiratory motion. No central or hilar pulmonary embolus. The segmental and distal pulmonary artery is are not well evaluated. Right chest porta cath. Calcified coronary artery atherosclerosis. Negative thoracic aorta. Borderline to mild cardiomegaly. No pericardial effusion. Mediastinum/Nodes: No lymphadenopathy. Lungs/Pleura: New small layering left pleural effusion. New 26 mm confluent area of opacity in the peripheral posterior or medial basal segment of the left lower lobe (series 6, image 62). The lung here was clear in 2008. Mild scarring in the lingula and along the left major fissure. Major airways are patent. Minor scarring in the right middle lobe. The right lung otherwise is negative. Upper Abdomen: Chronic benign appearing hepatic cysts, the largest have simple fluid densitometry. A rim calcified cyst along the anterior gallbladder fossa  is also stable since 2008. Negative visualized gallbladder, spleen, pancreas, adrenal glands, and bowel in the upper abdomen. Partially visible chronic left renal upper pole cyst has enlarged since 2008 but still has simple fluid densitometry. Negative visible right upper pole. Musculoskeletal: No acute osseous abnormality identified. No acute or suspicious osseous lesion identified. Review of the MIP images confirms the above findings. IMPRESSION: 1. Suboptimal contrast timing in the pulmonary arteries. No central or hilar pulmonary embolus, but if clinical suspicion persists (see # 2) recommend Nuclear Medicine V/Q scan. 2. New small layering left pleural effusion and  nonspecific 2.5 cm area of peripheral opacity in the posterior left lower lobe. Bronchopneumonia is one possible consideration. A pulmonary infarct could have this appearance. Follow-up chest imaging will be necessary (i.e. Repeat chest CT in 3-4 weeks following trial of antibiotic therapy if infection is suspected) in this clinical setting to ensure resolution and exclude malignancy. 3. Calcified coronary artery atherosclerosis. 4. Benign polycystic liver and kidney changes. Electronically Signed   By: Genevie Ann M.D.   On: 06/12/2016 15:15     Management plans discussed with the patient, family and they are in agreement.  CODE STATUS: FULL CODE  TOTAL TIME TAKING CARE OF THIS PATIENT: 45 minutes.    Max Sane M.D on 06/13/2016 at 10:55 AM  Between 7am to 6pm - Pager - [phone]  After 6pm go to [email or web address] - Proofreader  [facility]  Office  [phone]  CC: Primary care physician; Crecencio Mc, MD   Note: This dictation was prepared with Dragon dictation along with smaller phrase technology. Any transcriptional errors that result from this process are unintentional.

## 2016-06-17 ENCOUNTER — Telehealth: Payer: Self-pay | Admitting: *Deleted

## 2016-06-17 ENCOUNTER — Encounter: Payer: Self-pay | Admitting: Cardiovascular Disease

## 2016-06-17 ENCOUNTER — Encounter: Payer: Self-pay | Admitting: *Deleted

## 2016-06-17 ENCOUNTER — Ambulatory Visit (INDEPENDENT_AMBULATORY_CARE_PROVIDER_SITE_OTHER): Payer: Medicare Other | Admitting: Cardiovascular Disease

## 2016-06-17 VITALS — BP 136/98 | HR 66 | Ht 62.5 in | Wt 276.5 lb

## 2016-06-17 DIAGNOSIS — Z87891 Personal history of nicotine dependence: Secondary | ICD-10-CM | POA: Diagnosis not present

## 2016-06-17 DIAGNOSIS — J90 Pleural effusion, not elsewhere classified: Secondary | ICD-10-CM

## 2016-06-17 DIAGNOSIS — R0602 Shortness of breath: Secondary | ICD-10-CM

## 2016-06-17 DIAGNOSIS — Z136 Encounter for screening for cardiovascular disorders: Secondary | ICD-10-CM

## 2016-06-17 DIAGNOSIS — I25119 Atherosclerotic heart disease of native coronary artery with unspecified angina pectoris: Secondary | ICD-10-CM | POA: Diagnosis not present

## 2016-06-17 DIAGNOSIS — I209 Angina pectoris, unspecified: Secondary | ICD-10-CM

## 2016-06-17 MED ORDER — ATORVASTATIN CALCIUM 10 MG PO TABS: 10.0000 mg | ORAL_TABLET | Freq: Every day | ORAL | 11 refills | Status: DC

## 2016-06-17 MED ORDER — FUROSEMIDE 20 MG PO TABS: 20.0000 mg | ORAL_TABLET | Freq: Every day | ORAL | 3 refills | Status: DC | PRN

## 2016-06-17 NOTE — Patient Instructions (Addendum)
Medication Instructions:    Goal top number of blood pressure <140 Bottom number <90  For SOB, take lasix as needed perhaps twice a week  Start lipitor 10 mg daily for coronary disease  Labwork:  No new labs needed  Testing/Procedures:  CT scan chest in 3 to 4 weeks for pleural effusion, shortness of breath   I recommend watching educational videos on topics of interest to you at:       [email or web address]  Enter code: HEARTCARE    Follow-Up: It was a pleasure seeing you in the office today. Please call us if you have new issues that need to be addressed before your next appt.  [phone]  Your physician wants you to follow-up in: 6 months.  You will receive a reminder letter in the mail two months in advance. If you don't receive a letter, please call our office to schedule the follow-up appointment.  If you need a refill on your cardiac medications before your next appointment, please call your pharmacy.

## 2016-06-17 NOTE — Telephone Encounter (Signed)
Called pt to set up appt. Explained that Dr. Derrel Nip is not accepting new patients at this time. Gave her the names of the providers that are. Pt will call back to set up appt when she has some time to think about it.

## 2016-06-17 NOTE — Telephone Encounter (Signed)
S/w patient.  She was driving and could not write down the date and time of the chest CT. I gave it to her verbally and she was fine with me sending her a My Chart Message. She will call us if she does not get the message.

## 2016-06-17 NOTE — Progress Notes (Signed)
Cardiology Office Note  Date:  06/17/2016   ID:  Christina Nielsen, DOB [birth year], MRN [id number]  PCP:  No primary care provider on file.   Chief Complaint  Patient presents with  . other    New Patient. ED-to-hosp tcm for SOB/back pain 12/28. Pt states symptoms have eased off since hospital. Reviewed meds with pt verbally.    HPI:   66 year old woman with  History of Hodgkin's and hodgins lymphoma ,chemo in 2000, followed by XRT,  in remission, obesity, history of smoking,  presentation to  Hospital   at then the of December 2017 with  vague left  Chest , shoulder and neck pain.  She presents to the cardiology office by self-referral for further evaluation of her chest pain and risk factors She quit smoking one year ago She continues to have cutaneous port in place previously used for chemotherapy over 10 years ago. Does not have primary care physician  Recent hospital records reviewed with her in detail She had CT scan done in the hospital with haziness left lower lobe, pleural effusion, treated as pneumonia with antibiotics for 5 days, no large PE (poor contrast timing, difficult to rule out small vessel clots) Recommended to repeat CT scan in 3-4 weeks Discharged on aspirin, ibuprofen, Levaquin She reports that she Had soreness left lung base when she went into the hospital, now resolved  Reports that otherwise she feels well, denies any further chest pain, shortness of breath Rarely has a Gurgle at night sleeping, also with symptoms of  Indigestion For the past 2 months or so, Sits for long periods of time in a car, 3 days a week Husband is working, she sits in the car and does the books. Gets out of a car rarely, possibly 2 times to go to the bathroom  Review of CT scan by myself with Ms. Doakes in the office shows coronary artery atherosclerosis,  mid LAD, hazy at base with small left pleural effusion  Family history, father with heart failure, sister with atrial  fibrillation  EKG showing normal sinus rhythm with rate 66 bpm, left anterior fascicular block, LVH with repolarization abnormality   PMH:   has a past medical history of Basal cell carcinoma (11/2015); Hodgkin's lymphoma ([place]); and Non Hodgkin's lymphoma ([place]).  PSH:    Past Surgical History:  Procedure Laterality Date  . MOHS SURGERY    . PORTACATH PLACEMENT      Current Outpatient Prescriptions  Medication Sig Dispense Refill  . aspirin EC 81 MG tablet Take 81 mg by mouth daily.    Marland Kitchen ibuprofen (ADVIL,MOTRIN) 600 MG tablet Take 1 tablet (600 mg total) by mouth every 6 (six) hours as needed for moderate pain. 15 tablet 0  . atorvastatin (LIPITOR) 10 MG tablet Take 1 tablet (10 mg total) by mouth daily. 30 tablet 11  . furosemide (LASIX) 20 MG tablet Take 1 tablet (20 mg total) by mouth daily as needed. 30 tablet 3   No current facility-administered medications for this visit.      Allergies:   Patient has no known allergies.   Social History:  The patient  reports that she quit smoking about 12 months ago. She has never used smokeless tobacco. She reports that she does not drink alcohol or use drugs.   Family History:   family history includes Asthma in her mother; Atrial fibrillation in her brother and sister; Heart failure in her father.    Review of Systems: Review of Systems  Constitutional: Negative.   Respiratory: Negative.   Cardiovascular: Negative.   Gastrointestinal: Negative.        Indigestion  Musculoskeletal: Negative.   Neurological: Negative.   Psychiatric/Behavioral: Negative.   All other systems reviewed and are negative.    PHYSICAL EXAM: VS:  BP (!) 136/98 (BP Location: Right Arm, Patient Position: Sitting, Cuff Size: Large)   Pulse 66   Ht 5' 2.5" (1.588 m)   Wt 276 lb 8 oz (125.4 kg)   BMI 49.77 kg/m  , BMI Body mass index is 49.77 kg/m. GEN: Well nourished, well developed, in no acute distress  HEENT: normal  Neck: no JVD, carotid  bruits, or masses Cardiac: RRR; no murmurs, rubs, or gallops,no edema  Respiratory:  clear to auscultation bilaterally, normal work of breathing GI: soft, nontender, nondistended, + BS MS: no deformity or atrophy  Skin: warm and dry, no rash Neuro:  Strength and sensation are intact Psych: euthymic mood, full affect    Recent Labs: 06/11/2016: BUN 9; Creatinine, Ser 0.82; Hemoglobin 14.7; Platelets 277; Potassium 3.6; Sodium 138    Lipid Panel Lab Results  Component Value Date   CHOL 141 06/12/2016   HDL 50 06/12/2016   LDLCALC 68 06/12/2016   TRIG 113 06/12/2016      Wt Readings from Last 3 Encounters:  06/17/16 276 lb 8 oz (125.4 kg)  06/11/16 271 lb 6.4 oz (123.1 kg)  06/11/16 270 lb (122.5 kg)       ASSESSMENT AND PLAN:  Coronary artery disease involving native coronary artery of native heart with angina pectoris ([place]) - Plan: CT Chest Wo Contrast She denies significant chest pain symptoms concerning for angina Recent CT scan showing mid LAD coronary calcifications reviewed with her Stressed importance of smoking cessation, aggressive risk factor modification Recommended she start low-dose cholesterol medication  Smoking history - Plan: CT Chest Wo Contrast Reports that she stopped smoking last year, occasionally has desire to smoke Recommended cessation  Morbid obesity ([place]) - Plan: CT Chest Wo Contrast We have encouraged continued exercise, careful diet management in an effort to lose weight.  Screening for cardiovascular condition - Plan: EKG 12-Lead, CT Chest Wo Contrast CT scan reviewed with her, minimal coronary calcification in aortic arch, no significant calcification noted in the descending aorta  Pleural effusion - Plan: CT Chest Wo Contrast Repeat CT scan chest ordered for 3 weeks given haziness at the left base, pleural effusion, unclear etiology. She did complete 5 days of Levaquin, Denies any significant symptoms such as cough Etiology of her  fusion unclear, unable to exclude diastolic CHF Suggested she take Lasix sparingly, possibly 2 or 3 times per week prior to CT scan  SOB (shortness of breath) - Plan: CT Chest Wo Contrast She denies significant shortness of breath on exertion For any worsening symptoms, would order echocardiogram, stress test   Total encounter time more than 45 minutes  Greater than 50% was spent in counseling and coordination of care with the patient   Disposition:   F/U  6 months   Orders Placed This Encounter  Procedures  . CT Chest Wo Contrast  . EKG 12-Lead     Signed, Esmond Plants, M.D., Ph.D. 06/17/2016  [place], [place]unice

## 2016-06-26 ENCOUNTER — Other Ambulatory Visit: Payer: Self-pay | Admitting: Internal Medicine

## 2016-06-26 DIAGNOSIS — R918 Other nonspecific abnormal finding of lung field: Secondary | ICD-10-CM

## 2016-06-26 DIAGNOSIS — Z8572 Personal history of non-Hodgkin lymphomas: Secondary | ICD-10-CM | POA: Insufficient documentation

## 2016-06-26 DIAGNOSIS — I700 Atherosclerosis of aorta: Secondary | ICD-10-CM | POA: Insufficient documentation

## 2016-06-26 DIAGNOSIS — Z79899 Other long term (current) drug therapy: Secondary | ICD-10-CM | POA: Diagnosis not present

## 2016-06-30 DIAGNOSIS — L821 Other seborrheic keratosis: Secondary | ICD-10-CM | POA: Diagnosis not present

## 2016-06-30 DIAGNOSIS — Z85828 Personal history of other malignant neoplasm of skin: Secondary | ICD-10-CM | POA: Diagnosis not present

## 2016-07-03 ENCOUNTER — Other Ambulatory Visit: Payer: Self-pay | Admitting: Cardiothoracic Surgery

## 2016-07-03 ENCOUNTER — Ambulatory Visit: Payer: Self-pay | Admitting: Cardiothoracic Surgery

## 2016-07-03 ENCOUNTER — Encounter: Payer: Self-pay | Admitting: Cardiothoracic Surgery

## 2016-07-03 ENCOUNTER — Encounter
Admission: RE | Admit: 2016-07-03 | Discharge: 2016-07-03 | Disposition: A | Discharge status: Home / Self Care | Payer: Medicare Other | Source: Ambulatory Visit | Attending: Internal Medicine | Admitting: Internal Medicine

## 2016-07-03 ENCOUNTER — Ambulatory Visit (INDEPENDENT_AMBULATORY_CARE_PROVIDER_SITE_OTHER): Payer: Medicare Other | Admitting: Cardiothoracic Surgery

## 2016-07-03 VITALS — BP 144/88 | HR 71 | Temp 97.6°F | Ht 62.0 in | Wt 273.0 lb

## 2016-07-03 DIAGNOSIS — I25119 Atherosclerotic heart disease of native coronary artery with unspecified angina pectoris: Secondary | ICD-10-CM

## 2016-07-03 DIAGNOSIS — R918 Other nonspecific abnormal finding of lung field: Secondary | ICD-10-CM

## 2016-07-03 DIAGNOSIS — R911 Solitary pulmonary nodule: Secondary | ICD-10-CM | POA: Diagnosis not present

## 2016-07-03 LAB — GLUCOSE, CAPILLARY: GLUCOSE-CAPILLARY: 117 mg/dL — AB (ref 65–99)

## 2016-07-03 MED ORDER — FLUDEOXYGLUCOSE F - 18 (FDG) INJECTION
13.0000 | Freq: Once | INTRAVENOUS | Status: AC | PRN
  Administered 2016-07-03: 12.84 via INTRAVENOUS

## 2016-07-03 NOTE — Progress Notes (Signed)
Patient ID: Christina Nielsen, female   DOB: [birth year], 66 y.o.   MRN: [id number]  Chief Complaint  Patient presents with  . Follow-up    Follow up on Ct scan    Referred By Emily Filbert Reason for Referral LLL mass  HPI Location, Quality, Duration, Severity, Timing, Context, Modifying Factors, Associated Signs and Symptoms.  Christina Nielsen is a 66 y.o. female.  She had an episode of acute shortness of breath which prompted her evaluation. She said has a long-standing history of morbid obesity and functional decline secondary to that. In addition with advancing age she's noticed she's been more short of breath but over the course of several days in early December she began experiencing acute shortness of breath without any other associated symptoms. She denied any fevers. She denied any cough or sputum production. She had a chest x-ray made which revealed a possible left lower lobe pneumonia with pleural effusion. She was treated for that and had a repeat CT scan. The CT scan showed a left lower lobe mass measuring about 2 cm in size associated with a small left-sided pleural effusion. She then went on to have a PET scan done in the PET scan revealed that the left pleural effusion has essentially resolved. The left lower lobe mass was markedly better implying a nonmalignant etiology. It was felt that this was most likely related to some pneumonia with a parapneumonic effusion. Patient states that her breathing is actually improved over the last several weeks. She is not currently complaining of any significant cough or fever.   Past Medical History:  Diagnosis Date  . Angina at rest [facility])   . Basal cell carcinoma 11/2015   nose  . COPD (chronic obstructive pulmonary disease) ([place])   . Hodgkin's lymphoma ([place])   . Non Hodgkin's lymphoma [facility])     Past Surgical History:  Procedure Laterality Date  . MOHS SURGERY    . PORTACATH PLACEMENT      Family History  Problem Relation Age of Onset   . Heart failure Father   . Atrial fibrillation Sister   . Asthma Mother   . Atrial fibrillation Brother     Social History Social History  Substance Use Topics  . Smoking status: Former Smoker    Quit date: 05/21/2015  . Smokeless tobacco: Never Used     Comment: quit 05/2015  . Alcohol use No    No Known Allergies  Current Outpatient Prescriptions  Medication Sig Dispense Refill  . aspirin EC 81 MG tablet Take 81 mg by mouth daily.    Marland Kitchen atorvastatin (LIPITOR) 10 MG tablet Take 1 tablet (10 mg total) by mouth daily. 30 tablet 11  . furosemide (LASIX) 20 MG tablet Take 1 tablet (20 mg total) by mouth daily as needed. 30 tablet 3  . ibuprofen (ADVIL,MOTRIN) 600 MG tablet Take 1 tablet (600 mg total) by mouth every 6 (six) hours as needed for moderate pain. 15 tablet 0   No current facility-administered medications for this visit.       Review of Systems A complete review of systems was asked and was negative except for the following positive findingsShortness of breath, severe back pain, joint pain  Blood pressure (!) 144/88, pulse 71, temperature 97.6 F (36.4 C), temperature source Oral, height 5\' 2"  (1.575 m), weight 273 lb (123.8 kg), SpO2 97 %.  Physical Exam CONSTITUTIONAL:  Pleasant, well-developed, well-nourished, and in no acute distress. EYES: Pupils equal and reactive to light, Sclera  non-icteric EARS, NOSE, MOUTH AND THROAT:  The oropharynx was clear.  Dentition is good repair.  Oral mucosa pink and moist. LYMPH NODES:  Lymph nodes in the neck and axillae were normal RESPIRATORY:  Lungs were clear.  Normal respiratory effort without pathologic use of accessory muscles of respiration CARDIOVASCULAR: Heart was regular without murmurs.  There were no carotid bruits. GI: The abdomen was soft, nontender, and nondistended. There were no palpable masses. There was no hepatosplenomegaly. There were normal bowel sounds in all quadrants. GU:  Rectal deferred.    MUSCULOSKELETAL:  Normal muscle strength and tone.  No clubbing or cyanosis.   SKIN:  There were no pathologic skin lesions.  There were no nodules on palpation. NEUROLOGIC:  Sensation is normal.  Cranial nerves are grossly intact. PSYCH:  Oriented to person, place and time.  Mood and affect are normal.  Data Reviewed CT scan and PET scan  I have personally reviewed the patient's imaging, laboratory findings and medical records.    Assessment    I have independently reviewed the patient's CT scan and PET scan. The dominant lesion in the left lower lobe is most likely nonmalignant. There has been some resolution but it has not been complete. Therefore like to bring her back again in one month with a noncontrast chest CT    Plan    Noncontrast chest CT in 1 month follow-up of left lower lobe mass       Nestor Lewandowsky, MD 07/03/2016, 2:50 PM

## 2016-07-03 NOTE — Patient Instructions (Addendum)
We will see you back in 4 weeks to have a CT Scan prior to seeing [place].  We will schedule your CT Scan and then call you with the appointment date and time.

## 2016-07-08 ENCOUNTER — Ambulatory Visit: Payer: Medicare Other

## 2016-07-17 DIAGNOSIS — R938 Abnormal findings on diagnostic imaging of other specified body structures: Secondary | ICD-10-CM | POA: Diagnosis not present

## 2016-07-17 DIAGNOSIS — K8020 Calculus of gallbladder without cholecystitis without obstruction: Secondary | ICD-10-CM | POA: Insufficient documentation

## 2016-07-31 ENCOUNTER — Encounter: Payer: Self-pay | Admitting: Cardiothoracic Surgery

## 2016-07-31 ENCOUNTER — Ambulatory Visit (INDEPENDENT_AMBULATORY_CARE_PROVIDER_SITE_OTHER): Payer: Medicare Other | Admitting: Cardiothoracic Surgery

## 2016-07-31 ENCOUNTER — Telehealth: Payer: Self-pay

## 2016-07-31 ENCOUNTER — Ambulatory Visit
Admission: RE | Admit: 2016-07-31 | Discharge: 2016-07-31 | Disposition: A | Discharge status: Home / Self Care | Payer: Medicare Other | Source: Ambulatory Visit | Attending: Cardiothoracic Surgery | Admitting: Cardiothoracic Surgery

## 2016-07-31 VITALS — BP 137/84 | HR 88 | Temp 98.3°F | Wt 273.0 lb

## 2016-07-31 DIAGNOSIS — I25119 Atherosclerotic heart disease of native coronary artery with unspecified angina pectoris: Secondary | ICD-10-CM

## 2016-07-31 DIAGNOSIS — K7689 Other specified diseases of liver: Secondary | ICD-10-CM | POA: Diagnosis not present

## 2016-07-31 DIAGNOSIS — I700 Atherosclerosis of aorta: Secondary | ICD-10-CM | POA: Insufficient documentation

## 2016-07-31 DIAGNOSIS — R911 Solitary pulmonary nodule: Secondary | ICD-10-CM

## 2016-07-31 DIAGNOSIS — R918 Other nonspecific abnormal finding of lung field: Secondary | ICD-10-CM

## 2016-07-31 DIAGNOSIS — N281 Cyst of kidney, acquired: Secondary | ICD-10-CM | POA: Insufficient documentation

## 2016-07-31 DIAGNOSIS — K8020 Calculus of gallbladder without cholecystitis without obstruction: Secondary | ICD-10-CM | POA: Diagnosis not present

## 2016-07-31 DIAGNOSIS — I2510 Atherosclerotic heart disease of native coronary artery without angina pectoris: Secondary | ICD-10-CM | POA: Diagnosis not present

## 2016-07-31 NOTE — Telephone Encounter (Signed)
Called patient but had to leave her a voicemail to return my call. 

## 2016-07-31 NOTE — Patient Instructions (Signed)
Please give [place] a call on Monday for [place] to give you your CT Scan results.  We will call you in 6 months to remind you of your CT Scan and follow up appointment with Dr. Genevive Bi.

## 2016-07-31 NOTE — Addendum Note (Signed)
Addended by: Wayna Chalet on: 07/31/2016 02:59 PM   Modules accepted: Orders

## 2016-07-31 NOTE — Progress Notes (Signed)
  Patient ID: Christina Nielsen, female   DOB: 01-30-1951, 66 y.o.   MRN: [id number]  HISTORY: This patient returns today in follow-up. She has been tobacco free for over a year now. She does state that she does get short of breath which she attributes to her deconditioning. She also has some back problems which also make it difficult for her to exercise. She did have a CT scan today. She denied any fevers or chills.   Vitals:   07/31/16 1326  BP: 137/84  Pulse: 88  Temp: 98.3 F (36.8 C)     EXAM:    Resp: Lungs are clear bilaterally.  No respiratory distress, normal effort. Heart:  Regular without murmurs Abd:  Abdomen is soft, non distended and non tender. No masses are palpable.  There is no rebound and no guarding.  Neurological: Alert and oriented to person, place, and time. Coordination normal.  Skin: Skin is warm and dry. No rash noted. No diaphoretic. No erythema. No pallor.  Psychiatric: Normal mood and affect. Normal behavior. Judgment and thought content normal.    ASSESSMENT: I have independently reviewed the patient's CT scan. There is a resolving left lower lobe lung nodule. This is most likely inflammatory in nature. There are a multitude of other scattered pulmonary nodules.   PLAN:   I explained to the patient that I did not see anything that was worrisome for malignancy. However I would like to see her back again in 6 months with a CT scan. She is agreeable to doing that. She will also contact us early next week to get the official radiology reading.    Nestor Lewandowsky, MD

## 2016-08-05 NOTE — Telephone Encounter (Signed)
Called patient again and left her a voicemail.   Patient's CT Scan is better than the previous. We will see her back in August with a CT Scan prior to seeing Dr. Genevive Bi.

## 2016-08-24 ENCOUNTER — Other Ambulatory Visit: Payer: Self-pay

## 2016-08-24 MED ORDER — ATORVASTATIN CALCIUM 10 MG PO TABS: 10.0000 mg | ORAL_TABLET | Freq: Every day | ORAL | 2 refills | Status: DC

## 2016-10-09 ENCOUNTER — Other Ambulatory Visit: Payer: Self-pay

## 2016-10-09 MED ORDER — ATORVASTATIN CALCIUM 10 MG PO TABS: 10.0000 mg | ORAL_TABLET | Freq: Every day | ORAL | 2 refills | Status: DC

## 2016-10-09 NOTE — Telephone Encounter (Signed)
Requested Prescriptions   Signed Prescriptions Disp Refills  . atorvastatin (LIPITOR) 10 MG tablet 30 tablet 2    Sig: Take 1 tablet (10 mg total) by mouth daily.    Authorizing Provider: Minna Merritts    Ordering User: Janan Ridge

## 2017-01-22 DIAGNOSIS — R918 Other nonspecific abnormal finding of lung field: Secondary | ICD-10-CM | POA: Insufficient documentation

## 2017-01-22 DIAGNOSIS — Z79899 Other long term (current) drug therapy: Secondary | ICD-10-CM | POA: Diagnosis not present

## 2017-01-22 DIAGNOSIS — E782 Mixed hyperlipidemia: Secondary | ICD-10-CM | POA: Insufficient documentation

## 2017-01-22 DIAGNOSIS — I700 Atherosclerosis of aorta: Secondary | ICD-10-CM | POA: Diagnosis not present

## 2017-01-22 DIAGNOSIS — R030 Elevated blood-pressure reading, without diagnosis of hypertension: Secondary | ICD-10-CM | POA: Diagnosis not present

## 2017-01-28 ENCOUNTER — Ambulatory Visit
Admission: RE | Admit: 2017-01-28 | Discharge: 2017-01-28 | Disposition: A | Discharge status: Home / Self Care | Payer: Medicare Other | Source: Ambulatory Visit | Attending: Cardiothoracic Surgery | Admitting: Cardiothoracic Surgery

## 2017-01-28 DIAGNOSIS — N281 Cyst of kidney, acquired: Secondary | ICD-10-CM | POA: Insufficient documentation

## 2017-01-28 DIAGNOSIS — K229 Disease of esophagus, unspecified: Secondary | ICD-10-CM | POA: Insufficient documentation

## 2017-01-28 DIAGNOSIS — R935 Abnormal findings on diagnostic imaging of other abdominal regions, including retroperitoneum: Secondary | ICD-10-CM | POA: Insufficient documentation

## 2017-01-28 DIAGNOSIS — I2510 Atherosclerotic heart disease of native coronary artery without angina pectoris: Secondary | ICD-10-CM | POA: Diagnosis not present

## 2017-01-28 DIAGNOSIS — R911 Solitary pulmonary nodule: Secondary | ICD-10-CM

## 2017-01-28 DIAGNOSIS — R0602 Shortness of breath: Secondary | ICD-10-CM | POA: Diagnosis not present

## 2017-01-28 DIAGNOSIS — R918 Other nonspecific abnormal finding of lung field: Secondary | ICD-10-CM | POA: Diagnosis not present

## 2017-01-28 DIAGNOSIS — I700 Atherosclerosis of aorta: Secondary | ICD-10-CM | POA: Diagnosis not present

## 2017-01-28 DIAGNOSIS — K7689 Other specified diseases of liver: Secondary | ICD-10-CM | POA: Insufficient documentation

## 2017-01-29 ENCOUNTER — Ambulatory Visit (INDEPENDENT_AMBULATORY_CARE_PROVIDER_SITE_OTHER): Payer: Medicare Other | Admitting: Cardiothoracic Surgery

## 2017-01-29 ENCOUNTER — Encounter: Payer: Self-pay | Admitting: Cardiothoracic Surgery

## 2017-01-29 VITALS — BP 137/83 | HR 62 | Temp 97.8°F | Resp 18 | Ht 62.0 in | Wt 273.6 lb

## 2017-01-29 DIAGNOSIS — R911 Solitary pulmonary nodule: Secondary | ICD-10-CM

## 2017-01-29 DIAGNOSIS — I25119 Atherosclerotic heart disease of native coronary artery with unspecified angina pectoris: Secondary | ICD-10-CM | POA: Diagnosis not present

## 2017-01-29 NOTE — Patient Instructions (Signed)
Please call our office if you have any questions or concerns.  

## 2017-02-11 NOTE — Progress Notes (Signed)
  Patient ID: Christina Nielsen, female   DOB: Apr 02, 1951, 66 y.o.   MRN: [id number]  HISTORY: She returns today in follow-up. She has no new acute problems. She denies any shortness of breath, fever, chills. She did have a CT scan done. I have independently reviewed that.   Vitals:   01/29/17 0757  BP: 137/83  Pulse: 62  Resp: 18  Temp: 97.8 F (36.6 C)  SpO2: 95%     EXAM:    Resp: Lungs are clear bilaterally.  No respiratory distress, normal effort. Heart:  Regular without murmurs Abd:  Abdomen is soft, non distended and non tender. No masses are palpable.  There is no rebound and no guarding.  Neurological: Alert and oriented to person, place, and time. Coordination normal.  Skin: Skin is warm and dry. No rash noted. No diaphoretic. No erythema. No pallor.  Psychiatric: Normal mood and affect. Normal behavior. Judgment and thought content normal.    ASSESSMENT: Independent review the chest CT in comparison with the prior CT scan reveals that the nodule in the left lower lobe is essentially resolved. There are multiple other scattered bilateral pulmonary nodules that are stable.   PLAN:   Since the lesion is now resolved I do not believe that she requires any additional follow-up on CT. She will continue her follow-up with her primary care physician.    Nestor Lewandowsky, MD

## 2017-06-24 DIAGNOSIS — M5010 Cervical disc disorder with radiculopathy, unspecified cervical region: Secondary | ICD-10-CM | POA: Diagnosis not present

## 2017-06-24 DIAGNOSIS — R1312 Dysphagia, oropharyngeal phase: Secondary | ICD-10-CM | POA: Diagnosis not present

## 2017-09-08 ENCOUNTER — Other Ambulatory Visit: Payer: Self-pay

## 2017-09-08 MED ORDER — ATORVASTATIN CALCIUM 10 MG PO TABS: 10.0000 mg | ORAL_TABLET | Freq: Every day | ORAL | 3 refills | Status: DC

## 2017-09-08 NOTE — Telephone Encounter (Signed)
*  STAT* If patient is at the pharmacy, call can be transferred to refill team.   1. Which medications need to be refilled? (please list name of each medication and dose if known) LIPITOR  2. Which pharmacy/location (including street and city if local pharmacy) is medication to be sent to?[place]  3. Do they need a 30 day or 90 day supply? [place]

## 2017-10-25 DIAGNOSIS — R51 Headache: Secondary | ICD-10-CM | POA: Diagnosis not present

## 2017-10-25 DIAGNOSIS — M79604 Pain in right leg: Secondary | ICD-10-CM | POA: Diagnosis not present

## 2017-10-26 DIAGNOSIS — H25013 Cortical age-related cataract, bilateral: Secondary | ICD-10-CM | POA: Diagnosis not present

## 2018-01-05 DIAGNOSIS — H2513 Age-related nuclear cataract, bilateral: Secondary | ICD-10-CM | POA: Diagnosis not present

## 2018-02-15 DIAGNOSIS — H2512 Age-related nuclear cataract, left eye: Secondary | ICD-10-CM | POA: Diagnosis not present

## 2018-02-16 ENCOUNTER — Encounter: Payer: Self-pay | Admitting: *Deleted

## 2018-02-16 ENCOUNTER — Other Ambulatory Visit: Payer: Self-pay

## 2018-02-17 NOTE — Discharge Instructions (Signed)

## 2018-02-21 DIAGNOSIS — E782 Mixed hyperlipidemia: Secondary | ICD-10-CM | POA: Diagnosis not present

## 2018-02-21 DIAGNOSIS — Z79899 Other long term (current) drug therapy: Secondary | ICD-10-CM | POA: Diagnosis not present

## 2018-02-21 DIAGNOSIS — I700 Atherosclerosis of aorta: Secondary | ICD-10-CM | POA: Diagnosis not present

## 2018-02-21 DIAGNOSIS — Z23 Encounter for immunization: Secondary | ICD-10-CM | POA: Diagnosis not present

## 2018-02-21 DIAGNOSIS — Z0000 Encounter for general adult medical examination without abnormal findings: Secondary | ICD-10-CM | POA: Diagnosis not present

## 2018-02-23 ENCOUNTER — Ambulatory Visit: Payer: PPO | Admitting: Anesthesiology

## 2018-02-23 ENCOUNTER — Encounter
Admission: RE | Disposition: A | Discharge status: Home / Self Care | Payer: Self-pay | Source: Ambulatory Visit | Attending: Ophthalmology

## 2018-02-23 ENCOUNTER — Ambulatory Visit
Admission: RE | Admit: 2018-02-23 | Discharge: 2018-02-23 | Disposition: A | Discharge status: Home / Self Care | Payer: PPO | Source: Ambulatory Visit | Attending: Ophthalmology | Admitting: Ophthalmology

## 2018-02-23 DIAGNOSIS — Z6841 Body Mass Index (BMI) 40.0 and over, adult: Secondary | ICD-10-CM | POA: Insufficient documentation

## 2018-02-23 DIAGNOSIS — Z8572 Personal history of non-Hodgkin lymphomas: Secondary | ICD-10-CM | POA: Insufficient documentation

## 2018-02-23 DIAGNOSIS — E785 Hyperlipidemia, unspecified: Secondary | ICD-10-CM | POA: Diagnosis not present

## 2018-02-23 DIAGNOSIS — Z7982 Long term (current) use of aspirin: Secondary | ICD-10-CM | POA: Insufficient documentation

## 2018-02-23 DIAGNOSIS — I10 Essential (primary) hypertension: Secondary | ICD-10-CM | POA: Insufficient documentation

## 2018-02-23 DIAGNOSIS — Z8571 Personal history of Hodgkin lymphoma: Secondary | ICD-10-CM | POA: Insufficient documentation

## 2018-02-23 DIAGNOSIS — H2512 Age-related nuclear cataract, left eye: Secondary | ICD-10-CM | POA: Diagnosis not present

## 2018-02-23 DIAGNOSIS — Z85828 Personal history of other malignant neoplasm of skin: Secondary | ICD-10-CM | POA: Insufficient documentation

## 2018-02-23 DIAGNOSIS — Z79899 Other long term (current) drug therapy: Secondary | ICD-10-CM | POA: Diagnosis not present

## 2018-02-23 DIAGNOSIS — H25812 Combined forms of age-related cataract, left eye: Secondary | ICD-10-CM | POA: Diagnosis not present

## 2018-02-23 DIAGNOSIS — Z87891 Personal history of nicotine dependence: Secondary | ICD-10-CM | POA: Insufficient documentation

## 2018-02-23 HISTORY — DX: Dorsalgia, unspecified: M54.9

## 2018-02-23 HISTORY — DX: Essential (primary) hypertension: I10

## 2018-02-23 HISTORY — PX: CATARACT EXTRACTION W/PHACO: SHX586

## 2018-02-23 SURGERY — PHACOEMULSIFICATION, CATARACT, WITH IOL INSERTION
Anesthesia: Monitor Anesthesia Care | Site: Eye | Laterality: Left

## 2018-02-23 MED ORDER — LACTATED RINGERS IV SOLN: 10.0000 mL/h | INTRAVENOUS | Status: DC

## 2018-02-23 MED ORDER — NA HYALUR & NA CHOND-NA HYALUR 0.4-0.35 ML IO KIT
PACK | INTRAOCULAR | Status: DC | PRN
  Administered 2018-02-23: 1 mL via INTRAOCULAR

## 2018-02-23 MED ORDER — ERYTHROMYCIN 5 MG/GM OP OINT
TOPICAL_OINTMENT | OPHTHALMIC | Status: DC | PRN
  Administered 2018-02-23: 1 via OPHTHALMIC

## 2018-02-23 MED ORDER — MIDAZOLAM HCL 2 MG/2ML IJ SOLN
INTRAMUSCULAR | Status: DC | PRN
  Administered 2018-02-23: 2 mg via INTRAVENOUS

## 2018-02-23 MED ORDER — MOXIFLOXACIN HCL 0.5 % OP SOLN
1.0000 [drp] | OPHTHALMIC | Status: DC | PRN
  Administered 2018-02-23 (×3): 1 [drp] via OPHTHALMIC

## 2018-02-23 MED ORDER — ONDANSETRON HCL 4 MG/2ML IJ SOLN: 4.0000 mg | Freq: Once | INTRAMUSCULAR | Status: DC | PRN

## 2018-02-23 MED ORDER — BRIMONIDINE TARTRATE-TIMOLOL 0.2-0.5 % OP SOLN
OPHTHALMIC | Status: DC | PRN
  Administered 2018-02-23: 1 [drp] via OPHTHALMIC

## 2018-02-23 MED ORDER — ARMC OPHTHALMIC DILATING DROPS
1.0000 "application " | OPHTHALMIC | Status: DC | PRN
  Administered 2018-02-23 (×3): 1 via OPHTHALMIC

## 2018-02-23 MED ORDER — FENTANYL CITRATE (PF) 100 MCG/2ML IJ SOLN
INTRAMUSCULAR | Status: DC | PRN
  Administered 2018-02-23 (×2): 25 ug via INTRAVENOUS
  Administered 2018-02-23: 50 ug via INTRAVENOUS

## 2018-02-23 MED ORDER — CEFUROXIME OPHTHALMIC INJECTION 1 MG/0.1 ML
INJECTION | OPHTHALMIC | Status: DC | PRN
  Administered 2018-02-23: 0.1 mL via INTRACAMERAL

## 2018-02-23 MED ORDER — LIDOCAINE HCL (PF) 2 % IJ SOLN
INTRAOCULAR | Status: DC | PRN
  Administered 2018-02-23: 1 mL

## 2018-02-23 MED ORDER — EPINEPHRINE PF 1 MG/ML IJ SOLN
INTRAOCULAR | Status: DC | PRN
  Administered 2018-02-23: 52 mL via OPHTHALMIC

## 2018-02-23 SURGICAL SUPPLY — 27 items
CANNULA ANT/CHMB 27G (MISCELLANEOUS) ×1 IMPLANT
CANNULA ANT/CHMB 27GA (MISCELLANEOUS) ×3 IMPLANT
CARTRIDGE ABBOTT (MISCELLANEOUS) IMPLANT
GLOVE SURG LX 7.5 STRW (GLOVE) ×2
GLOVE SURG LX STRL 7.5 STRW (GLOVE) ×1 IMPLANT
GLOVE SURG TRIUMPH 8.0 PF LTX (GLOVE) ×3 IMPLANT
GOWN STRL REUS W/ TWL LRG LVL3 (GOWN DISPOSABLE) ×2 IMPLANT
GOWN STRL REUS W/TWL LRG LVL3 (GOWN DISPOSABLE) ×4
LENS IOL TECNIS ITEC 22.0 (Intraocular Lens) ×2 IMPLANT
MARKER SKIN DUAL TIP RULER LAB (MISCELLANEOUS) ×3 IMPLANT
NDL FILTER BLUNT 18X1 1/2 (NEEDLE) ×1 IMPLANT
NDL RETROBULBAR .5 NSTRL (NEEDLE) IMPLANT
NEEDLE FILTER BLUNT 18X 1/2SAF (NEEDLE) ×2
NEEDLE FILTER BLUNT 18X1 1/2 (NEEDLE) ×1 IMPLANT
PACK CATARACT BRASINGTON (MISCELLANEOUS) ×3 IMPLANT
PACK EYE AFTER SURG (MISCELLANEOUS) ×3 IMPLANT
PACK OPTHALMIC (MISCELLANEOUS) ×3 IMPLANT
RING MALYGIN 7.0 (MISCELLANEOUS) IMPLANT
SUT ETHILON 10-0 CS-B-6CS-B-6 (SUTURE)
SUT VICRYL  9 0 (SUTURE)
SUT VICRYL 9 0 (SUTURE) IMPLANT
SUTURE EHLN 10-0 CS-B-6CS-B-6 (SUTURE) IMPLANT
SYR 3ML LL SCALE MARK (SYRINGE) ×3 IMPLANT
SYR 5ML LL (SYRINGE) ×3 IMPLANT
SYR TB 1ML LUER SLIP (SYRINGE) ×3 IMPLANT
WATER STERILE IRR 500ML POUR (IV SOLUTION) ×3 IMPLANT
WIPE NON LINTING 3.25X3.25 (MISCELLANEOUS) ×3 IMPLANT

## 2018-02-23 NOTE — Anesthesia Postprocedure Evaluation (Signed)
Anesthesia Post Note  Patient: Christina Nielsen  Procedure(s) Performed: CATARACT EXTRACTION PHACO AND INTRAOCULAR LENS PLACEMENT (IOC) LEFT (Left Eye)  Patient location during evaluation: PACU Anesthesia Type: MAC Level of consciousness: awake and alert Pain management: pain level controlled Vital Signs Assessment: post-procedure vital signs reviewed and stable Respiratory status: spontaneous breathing, nonlabored ventilation, respiratory function stable and patient connected to nasal cannula oxygen Cardiovascular status: stable and blood pressure returned to baseline Postop Assessment: no apparent nausea or vomiting Anesthetic complications: no    Veda Canning

## 2018-02-23 NOTE — Op Note (Signed)
OPERATIVE NOTE  Christina Nielsen [id number] 02/23/2018   PREOPERATIVE DIAGNOSIS:  Nuclear sclerotic cataract left eye. H25.12   POSTOPERATIVE DIAGNOSIS:    Nuclear sclerotic cataract left eye.     PROCEDURE:  Phacoemusification with posterior chamber intraocular lens placement of the left eye   LENS:   Implant Name Type Inv. Item Serial No. Manufacturer Lot No. LRB No. Used  LENS IOL DIOP 22.0 - [id number] Intraocular Lens LENS IOL DIOP 22.0 [id number]  Left 1        ULTRASOUND TIME: 17  % of 0 minutes 47 seconds, CDE 8.1  SURGEON:  Wyonia Hough, MD   ANESTHESIA:  Topical with tetracaine drops and 2% Xylocaine jelly, augmented with 1% preservative-free intracameral lidocaine.    COMPLICATIONS:  None.   DESCRIPTION OF PROCEDURE:  The patient was identified in the holding room and transported to the operating room and placed in the supine position under the operating microscope.  The left eye was identified as the operative eye and it was prepped and draped in the usual sterile ophthalmic fashion.   A 1 millimeter clear-corneal paracentesis was made at the 1:30 position.  0.5 ml of preservative-free 1% lidocaine was injected into the anterior chamber.  The anterior chamber was filled with Viscoat viscoelastic.  A 2.4 millimeter keratome was used to make a near-clear corneal incision at the 10:30 position.  .  A curvilinear capsulorrhexis was made with a cystotome and capsulorrhexis forceps.  Balanced salt solution was used to hydrodissect and hydrodelineate the nucleus.   Phacoemulsification was then used in stop and chop fashion to remove the lens nucleus and epinucleus.  The remaining cortex was then removed using the irrigation and aspiration handpiece. Provisc was then placed into the capsular bag to distend it for lens placement.  A lens was then injected into the capsular bag.  The remaining viscoelastic was aspirated.   Wounds were hydrated with balanced salt  solution.  The anterior chamber was inflated to a physiologic pressure with balanced salt solution.  No wound leaks were noted. Cefuroxime 0.1 ml of a 10mg /ml solution was injected into the anterior chamber for a dose of 1 mg of intracameral antibiotic at the completion of the case.   Timolol and Brimonidine drops were applied to the eye.  The patient was taken to the recovery room in stable condition without complications of anesthesia or surgery.  Christina Nielsen 02/23/2018, 9:01 AM

## 2018-02-23 NOTE — H&P (Signed)
The History and Physical notes are on paper, have been signed, and are to be scanned. The patient remains stable and unchanged from the H&P.   Previous H&P reviewed, patient examined, and there are no changes.  Christina Nielsen 02/23/2018 8:08 AM

## 2018-02-23 NOTE — Anesthesia Preprocedure Evaluation (Signed)
Anesthesia Evaluation  Patient identified by MRN, date of birth, ID band Patient awake    Reviewed: Allergy & Precautions, NPO status   Airway Mallampati: II  TM Distance: >3 FB     Dental   Pulmonary neg recent URI, former smoker,    breath sounds clear to auscultation       Cardiovascular hypertension, (-) angina Rhythm:Regular Rate:Normal  HLD   Neuro/Psych    GI/Hepatic neg GERD  ,  Endo/Other  Morbid obesity (BMI 49)  Renal/GU      Musculoskeletal   Abdominal (+) + obese,   Peds  Hematology Hx Hodgkin's lymphoma Hx Non-Hodgkin's lymphoma   Anesthesia Other Findings Hx basal cell carcinoma 2017 - s/p MOHS  Reproductive/Obstetrics                             Anesthesia Physical Anesthesia Plan  ASA: III  Anesthesia Plan: MAC   Post-op Pain Management:    Induction:   PONV Risk Score and Plan: Midazolam and Treatment may vary due to age or medical condition  Airway Management Planned:   Additional Equipment:   Intra-op Plan:   Post-operative Plan:   Informed Consent: I have reviewed the patients History and Physical, chart, labs and discussed the procedure including the risks, benefits and alternatives for the proposed anesthesia with the patient or authorized representative who has indicated his/her understanding and acceptance.     Plan Discussed with: CRNA  Anesthesia Plan Comments:         Anesthesia Quick Evaluation

## 2018-02-23 NOTE — Anesthesia Procedure Notes (Signed)
Procedure Name: MAC Date/Time: 02/23/2018 8:41 AM Performed by: Janna Arch, CRNA Pre-anesthesia Checklist: Patient identified, Emergency Drugs available, Suction available, Timeout performed and Patient being monitored Patient Re-evaluated:Patient Re-evaluated prior to induction Oxygen Delivery Method: Nasal cannula Placement Confirmation: positive ETCO2

## 2018-02-23 NOTE — Transfer of Care (Signed)
Immediate Anesthesia Transfer of Care Note  Patient: Christina Nielsen  Procedure(s) Performed: CATARACT EXTRACTION PHACO AND INTRAOCULAR LENS PLACEMENT (IOC) LEFT (Left Eye)  Patient Location: PACU  Anesthesia Type: MAC  Level of Consciousness: awake, alert  and patient cooperative  Airway and Oxygen Therapy: Patient Spontanous Breathing and Patient connected to supplemental oxygen  Post-op Assessment: Post-op Vital signs reviewed, Patient's Cardiovascular Status Stable, Respiratory Function Stable, Patent Airway and No signs of Nausea or vomiting  Post-op Vital Signs: Reviewed and stable  Complications: No apparent anesthesia complications

## 2018-02-24 ENCOUNTER — Encounter: Payer: Self-pay | Admitting: Ophthalmology

## 2018-07-28 DIAGNOSIS — H40003 Preglaucoma, unspecified, bilateral: Secondary | ICD-10-CM | POA: Diagnosis not present

## 2018-08-02 DIAGNOSIS — H40003 Preglaucoma, unspecified, bilateral: Secondary | ICD-10-CM | POA: Diagnosis not present

## 2018-08-15 DIAGNOSIS — N390 Urinary tract infection, site not specified: Secondary | ICD-10-CM | POA: Diagnosis not present

## 2018-08-15 DIAGNOSIS — R0789 Other chest pain: Secondary | ICD-10-CM | POA: Diagnosis not present

## 2018-08-15 DIAGNOSIS — R0602 Shortness of breath: Secondary | ICD-10-CM | POA: Diagnosis not present

## 2018-08-16 ENCOUNTER — Other Ambulatory Visit: Payer: Self-pay | Admitting: Internal Medicine

## 2018-08-16 DIAGNOSIS — Z1231 Encounter for screening mammogram for malignant neoplasm of breast: Secondary | ICD-10-CM

## 2018-08-16 DIAGNOSIS — N390 Urinary tract infection, site not specified: Secondary | ICD-10-CM | POA: Diagnosis not present

## 2018-08-16 DIAGNOSIS — N644 Mastodynia: Secondary | ICD-10-CM

## 2018-09-18 ENCOUNTER — Other Ambulatory Visit: Payer: Self-pay | Admitting: Cardiovascular Disease

## 2018-09-20 ENCOUNTER — Other Ambulatory Visit: Payer: Self-pay | Admitting: Cardiovascular Disease

## 2018-09-20 MED ORDER — ATORVASTATIN CALCIUM 10 MG PO TABS: 10.0000 mg | ORAL_TABLET | Freq: Every day | ORAL | 30 refills | Status: AC

## 2018-09-20 NOTE — Telephone Encounter (Signed)
°*  STAT* If patient is at the pharmacy, call can be transferred to refill team.   1. Which medications need to be refilled? (please list name of each medication and dose if known) Atorvastatin 10 mg po q d   2. Which pharmacy/location (including street and city if local pharmacy) is medication to be sent to? [place]   3. Do they need a 30 day or 90 day supply? 19   FYI PATIENT DECLINED EVISIT

## 2018-10-06 ENCOUNTER — Telehealth: Payer: Self-pay

## 2018-10-06 NOTE — Telephone Encounter (Signed)
Called patient from recall.  No answer. VM was full.  Patient is past due for a follow up appointment.

## 2018-10-07 NOTE — Telephone Encounter (Signed)
Called patient from recall.  No answer. VM was full.  This is the 3rd attempt per recall list.  Will delete recall.

## 2019-01-24 ENCOUNTER — Other Ambulatory Visit: Payer: Self-pay | Admitting: Internal Medicine

## 2019-01-24 DIAGNOSIS — I700 Atherosclerosis of aorta: Secondary | ICD-10-CM | POA: Diagnosis not present

## 2019-01-24 DIAGNOSIS — E782 Mixed hyperlipidemia: Secondary | ICD-10-CM | POA: Diagnosis not present

## 2019-01-24 DIAGNOSIS — R1900 Intra-abdominal and pelvic swelling, mass and lump, unspecified site: Secondary | ICD-10-CM

## 2019-01-24 DIAGNOSIS — E538 Deficiency of other specified B group vitamins: Secondary | ICD-10-CM | POA: Diagnosis not present

## 2019-01-24 DIAGNOSIS — M5116 Intervertebral disc disorders with radiculopathy, lumbar region: Secondary | ICD-10-CM | POA: Diagnosis not present

## 2019-01-30 DIAGNOSIS — H40003 Preglaucoma, unspecified, bilateral: Secondary | ICD-10-CM | POA: Diagnosis not present

## 2019-02-02 ENCOUNTER — Ambulatory Visit
Admission: RE | Admit: 2019-02-02 | Discharge: 2019-02-02 | Disposition: A | Discharge status: Home / Self Care | Payer: PPO | Source: Ambulatory Visit | Attending: Internal Medicine | Admitting: Internal Medicine

## 2019-02-02 ENCOUNTER — Other Ambulatory Visit: Payer: Self-pay

## 2019-02-02 DIAGNOSIS — R1900 Intra-abdominal and pelvic swelling, mass and lump, unspecified site: Secondary | ICD-10-CM | POA: Diagnosis not present

## 2019-02-02 DIAGNOSIS — K5730 Diverticulosis of large intestine without perforation or abscess without bleeding: Secondary | ICD-10-CM | POA: Diagnosis not present

## 2019-02-02 DIAGNOSIS — N200 Calculus of kidney: Secondary | ICD-10-CM | POA: Diagnosis not present

## 2019-02-02 MED ORDER — IOHEXOL 300 MG/ML  SOLN
100.0000 mL | Freq: Once | INTRAMUSCULAR | Status: AC | PRN
  Administered 2019-02-02: 100 mL via INTRAVENOUS

## 2019-03-20 DIAGNOSIS — I700 Atherosclerosis of aorta: Secondary | ICD-10-CM | POA: Diagnosis not present

## 2019-03-20 DIAGNOSIS — Z0000 Encounter for general adult medical examination without abnormal findings: Secondary | ICD-10-CM | POA: Diagnosis not present

## 2019-03-20 DIAGNOSIS — E782 Mixed hyperlipidemia: Secondary | ICD-10-CM | POA: Diagnosis not present

## 2019-03-20 DIAGNOSIS — Z23 Encounter for immunization: Secondary | ICD-10-CM | POA: Diagnosis not present

## 2019-04-06 DIAGNOSIS — Z1212 Encounter for screening for malignant neoplasm of rectum: Secondary | ICD-10-CM | POA: Diagnosis not present

## 2019-04-06 DIAGNOSIS — Z1211 Encounter for screening for malignant neoplasm of colon: Secondary | ICD-10-CM | POA: Diagnosis not present

## 2019-05-25 DIAGNOSIS — M5116 Intervertebral disc disorders with radiculopathy, lumbar region: Secondary | ICD-10-CM | POA: Diagnosis not present

## 2019-07-31 DIAGNOSIS — H40003 Preglaucoma, unspecified, bilateral: Secondary | ICD-10-CM | POA: Diagnosis not present

## 2019-10-20 DIAGNOSIS — H40003 Preglaucoma, unspecified, bilateral: Secondary | ICD-10-CM | POA: Diagnosis not present

## 2019-11-15 IMAGING — CT CT ABDOMEN AND PELVIS WITH CONTRAST
2 of 5 series · 14 of 46 positions shown, 16 images · IV contrast (omnipaque)
Comparison: PET-CT-07/03/2016; CT abdomen pelvis-02/22/2007; chest
CT-01/28/2017; 07/31/2016

CLINICAL DATA: Intermittent right low back pain radiating to the
right leg for the past 6 months. Concern for right buttock mass on
physical examination. History of Hodgkin's lymphoma in 6446 and
3550, previously treated with chemotherapy

EXAM:
CT ABDOMEN AND PELVIS WITH CONTRAST
TECHNIQUE: Multidetector CT imaging of the abdomen and pelvis was performed
using the standard protocol following bolus administration of
intravenous contrast.
CONTRAST:  100mL OMNIPAQUE IOHEXOL 300 MG/ML  SOLN

[Series 2: abd pelvis · axial · 0.82mm/px · z∈[-1471,-1051]mm · 11 of 97 slices shown, 13 images]
[im 7/97  soft-tissue]
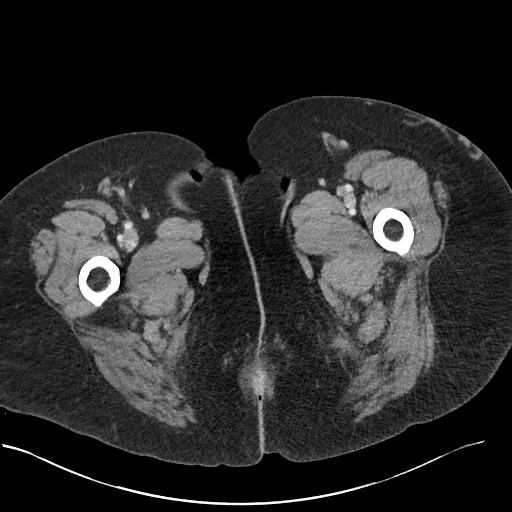
[im 7/97  bone]
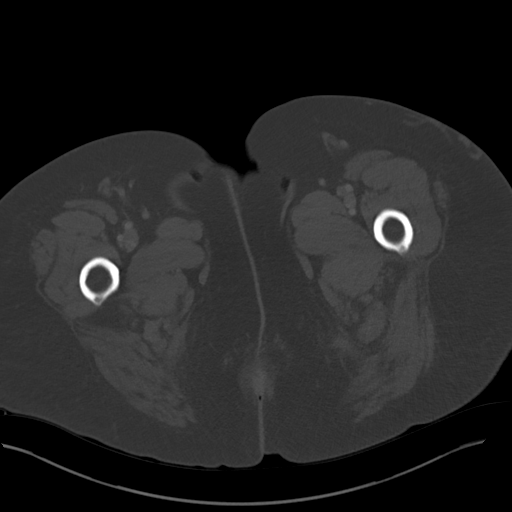
[im 19/97  soft-tissue]
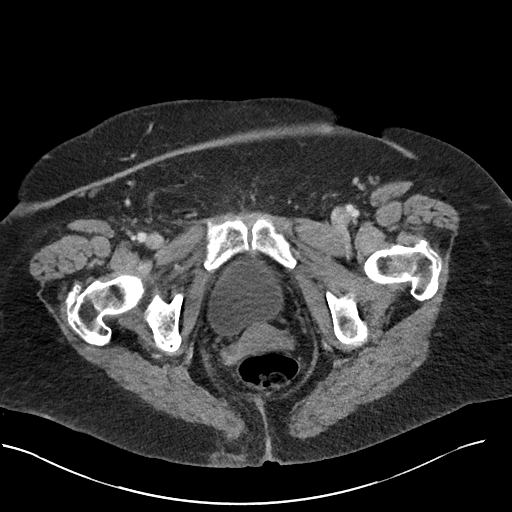
[im 25/97  soft-tissue]
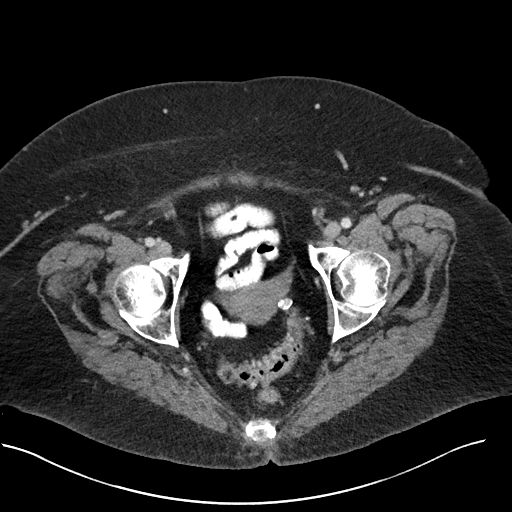
[im 31/97  soft-tissue]
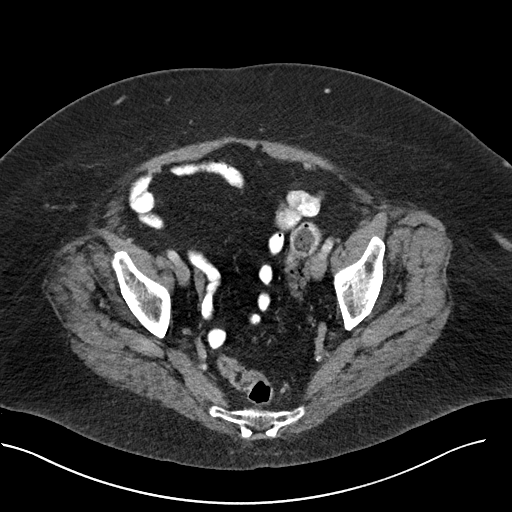
[im 43/97  soft-tissue]
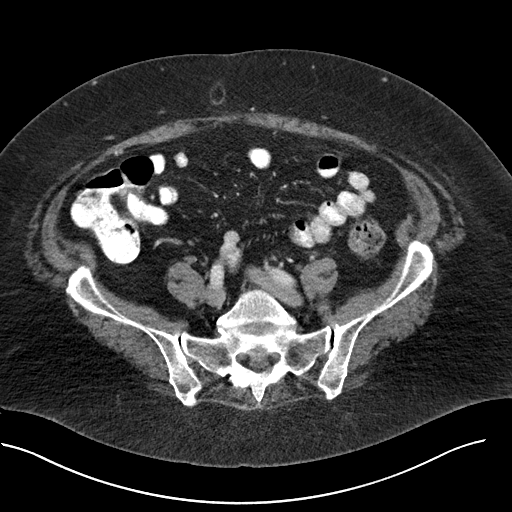
[im 49/97  soft-tissue]
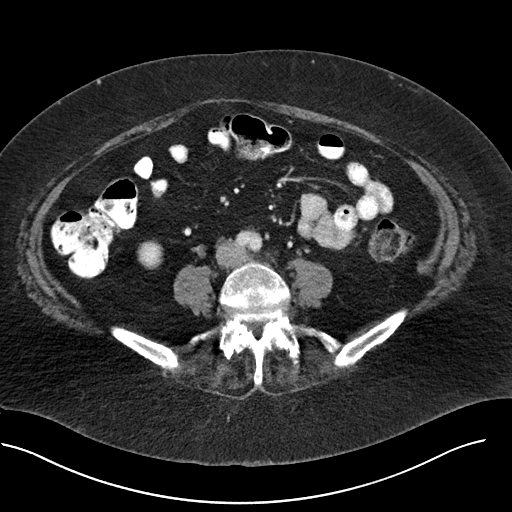
[im 55/97  soft-tissue]
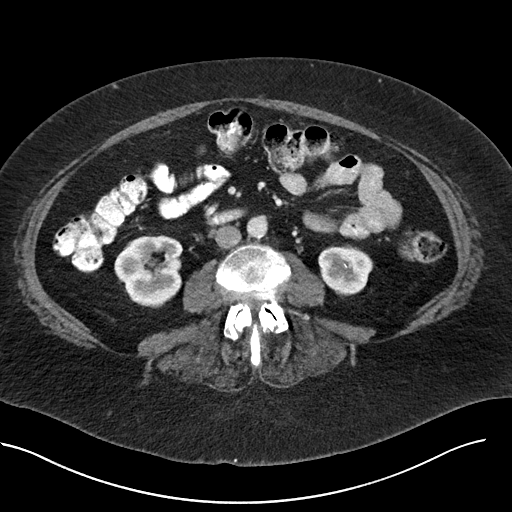
[im 67/97  soft-tissue]
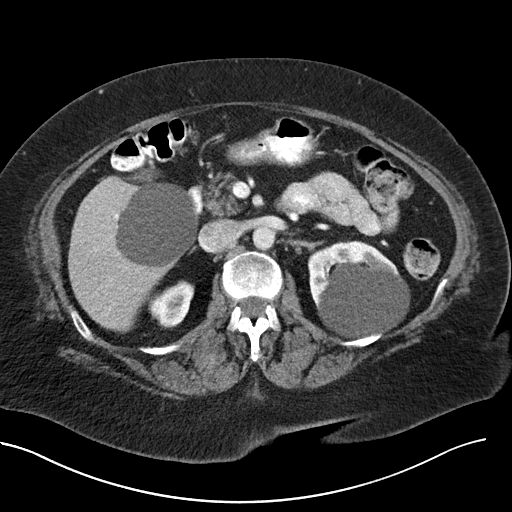
[im 73/97  soft-tissue]
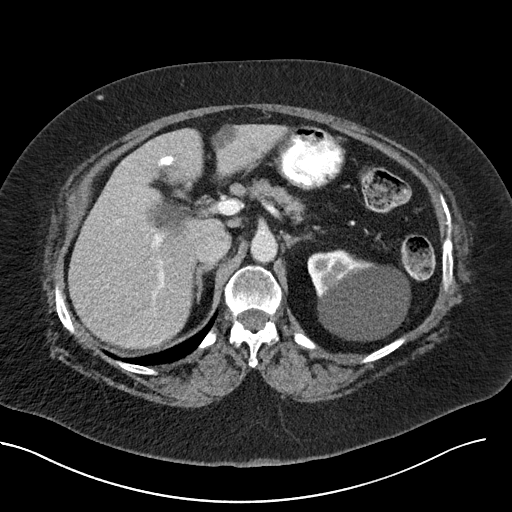
[im 73/97  bone]
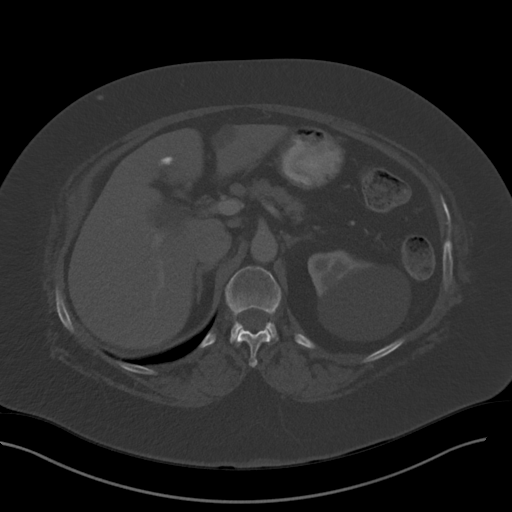
[im 79/97  soft-tissue]
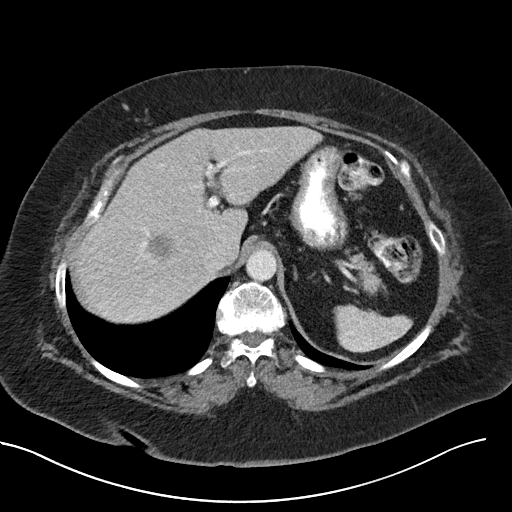
[im 91/97  soft-tissue]
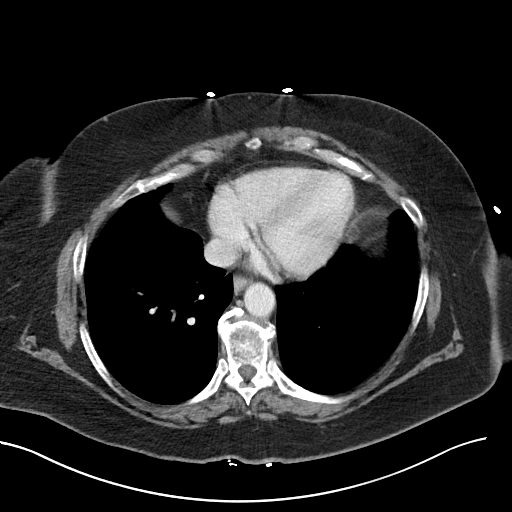

[Series 5: coronal abd pelvis · coronal · 0.82mm/px · 3 of 173 slices shown]
[im 58/173  soft-tissue]
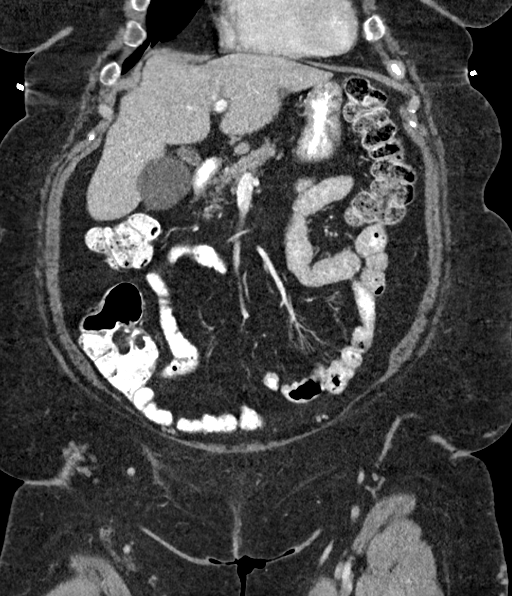
[im 77/173  soft-tissue]
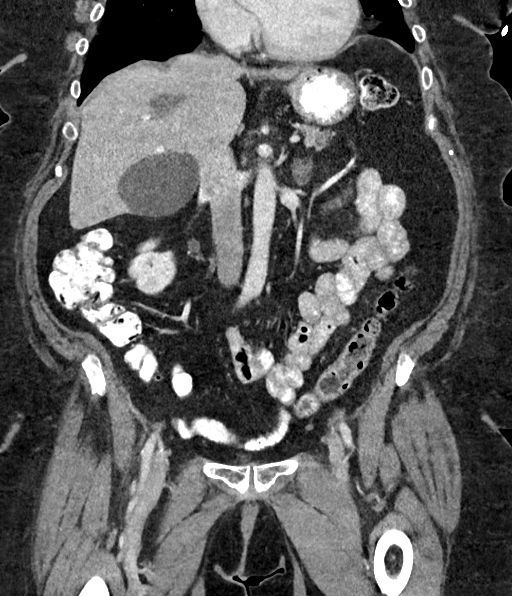
[im 96/173  soft-tissue]
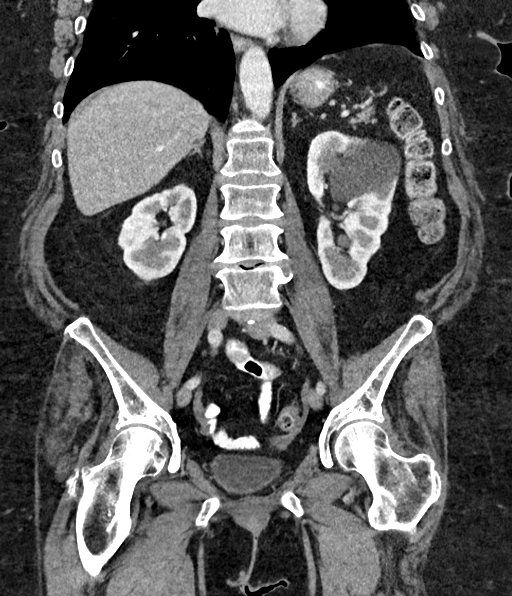

[14 of 46 positions shown; findings below may reference images not displayed]

FINDINGS: Lower chest: Limited visualization of the lower thorax is negative
for focal airspace opacity or pleural effusion.

Normal heart size.  No pericardial effusion.

Hepatobiliary: Normal hepatic contour. Redemonstrated multiple
hypoattenuating hepatic cysts, the largest of which within the
inferior medial aspect the right lobe of the liver measures
approximately 7.4 x 6.3 cm, grossly unchanged compared to PET-CT
performed [DATE]. Peripherally calcified approximately 2.0 x 1.5 cm
lesion adjacent to the gallbladder fossa (image 26, series 2) is
also unchanged compared to prior PET-CT and presumed represents a
minimally complex hepatic cyst. No definitive new discrete hepatic
lesions.

Normal noncontrast appearance of the gallbladder given
underdistention. No radiopaque gallstones. No intra or extrahepatic
biliary dilatation. No ascites.

Pancreas: Normal appearance of the pancreas.

Spleen: The spleen appears diminutive but otherwise normal in
appearance.

Adrenals/Urinary Tract: There is symmetric enhancement and excretion
of the bilateral kidneys. The large, approximately 8.3 x 6.8 cm
hypoattenuating (8 Hounsfield unit) partially exophytic cyst arising
from the posterosuperior aspect of the left kidney is unchanged
compared to chest CT performed [DATE]. Additional subcentimeter
hypoattenuating right-sided renal lesions are too small to
accurately characterize though favored to represent additional renal
cysts. There is a punctate (0.4 cm) nonobstructing stone within the
inferior pole the left kidney (coronal image 92, series 5) which is
unchanged compared to PET-CT performed [DATE]. Note is made of a
small right-sided extrarenal pelvis. No urinary obstruction or
perinephric stranding.

Normal appearance the bilateral adrenal glands.

Normal appearance of the urinary bladder given degree of distention.

Stomach/Bowel: Scattered colonic diverticulosis without evidence of
superimposed acute diverticulitis. Ingested enteric contrast extends
to the level of the transverse colon. Moderate to large colonic
stool burden without evidence of enteric obstruction. Normal
noncontrast appearance of the terminal ileum and the retrocecal
appendix. No pneumoperitoneum, pneumatosis or portal venous gas.

Vascular/Lymphatic: Atherosclerotic plaque within a normal caliber
abdominal aorta. The major branch vessels of the abdominal aorta
appear patent on this non CTA examination.

No bulky retroperitoneal, mesenteric, pelvic or inguinal
lymphadenopathy.

Reproductive: Normal appearance of the pelvic organs. No free fluid
the pelvic cul-de-sac.

Other: Linear subcutaneous stranding about the medial aspect of the
right side of the gluteal cleft with associated scattered
subcutaneous dystrophic calcifications (images 75 through 82, series
2) is unchanged compared to PET-CT performed [DATE] and was without
associated hypermetabolic activity at that time and thus is
presumably posttraumatic in etiology.

Musculoskeletal: No acute or aggressive osseous abnormalities. Grade
1 anterolisthesis of L3 upon L4 and L4 upon L5 without associated
pars defects. Mild-to-moderate multilevel lumbar spine DDD with
prominent disc osteophyte complex at T11-T12. Stigmata of DISH
within the thoracic spine.
IMPRESSION: 1. No acute findings within the abdomen or pelvis.
2. Linear subcutaneous stranding and scattered dystrophic
calcifications about the medial aspect of the right-side of the
gluteal cleft, potentially correlating with the patient's palpable
area of concern, is unchanged compared to PET-CT performed [DATE]
and was without associated hypermetabolic activity at that time and
thus, while nonspecific, is favored to be the sequela of previous
traumatic injury.
3. Mild-to-moderate multilevel lumbar spine DDD with prominent
posteriorly directed disc osteophyte complex at T11-T12.
4. Colonic diverticulosis without evidence superimposed acute
diverticulitis.
5. Punctate (4 mm) nonobstructing left-sided renal stone, unchanged
since at least prior PET-CT performed June 2016.  Aortic Atherosclerosis ([id number]-5T7.7).

## 2019-11-28 DIAGNOSIS — K2100 Gastro-esophageal reflux disease with esophagitis, without bleeding: Secondary | ICD-10-CM | POA: Diagnosis not present

## 2019-11-28 DIAGNOSIS — M5440 Lumbago with sciatica, unspecified side: Secondary | ICD-10-CM | POA: Diagnosis not present

## 2019-11-28 DIAGNOSIS — G8929 Other chronic pain: Secondary | ICD-10-CM | POA: Diagnosis not present

## 2019-11-28 DIAGNOSIS — R5383 Other fatigue: Secondary | ICD-10-CM | POA: Diagnosis not present

## 2019-12-01 DIAGNOSIS — H40003 Preglaucoma, unspecified, bilateral: Secondary | ICD-10-CM | POA: Diagnosis not present

## 2020-01-01 DIAGNOSIS — Z0000 Encounter for general adult medical examination without abnormal findings: Secondary | ICD-10-CM | POA: Diagnosis not present

## 2020-01-01 DIAGNOSIS — K2100 Gastro-esophageal reflux disease with esophagitis, without bleeding: Secondary | ICD-10-CM | POA: Diagnosis not present

## 2020-01-01 DIAGNOSIS — F330 Major depressive disorder, recurrent, mild: Secondary | ICD-10-CM | POA: Diagnosis not present

## 2020-03-13 DIAGNOSIS — Z79899 Other long term (current) drug therapy: Secondary | ICD-10-CM | POA: Diagnosis not present

## 2020-03-13 DIAGNOSIS — E782 Mixed hyperlipidemia: Secondary | ICD-10-CM | POA: Diagnosis not present

## 2020-03-20 DIAGNOSIS — E782 Mixed hyperlipidemia: Secondary | ICD-10-CM | POA: Diagnosis not present

## 2020-03-20 DIAGNOSIS — F330 Major depressive disorder, recurrent, mild: Secondary | ICD-10-CM | POA: Diagnosis not present

## 2020-03-20 DIAGNOSIS — Z0000 Encounter for general adult medical examination without abnormal findings: Secondary | ICD-10-CM | POA: Diagnosis not present

## 2020-03-20 DIAGNOSIS — I700 Atherosclerosis of aorta: Secondary | ICD-10-CM | POA: Diagnosis not present

## 2020-07-05 DIAGNOSIS — M25561 Pain in right knee: Secondary | ICD-10-CM | POA: Diagnosis not present

## 2020-09-16 DIAGNOSIS — E782 Mixed hyperlipidemia: Secondary | ICD-10-CM | POA: Diagnosis not present

## 2020-09-19 DIAGNOSIS — R739 Hyperglycemia, unspecified: Secondary | ICD-10-CM | POA: Diagnosis not present

## 2020-09-19 DIAGNOSIS — Z0000 Encounter for general adult medical examination without abnormal findings: Secondary | ICD-10-CM | POA: Diagnosis not present

## 2020-09-19 DIAGNOSIS — M76899 Other specified enthesopathies of unspecified lower limb, excluding foot: Secondary | ICD-10-CM | POA: Diagnosis not present

## 2020-09-19 DIAGNOSIS — F330 Major depressive disorder, recurrent, mild: Secondary | ICD-10-CM | POA: Diagnosis not present

## 2020-09-19 DIAGNOSIS — E782 Mixed hyperlipidemia: Secondary | ICD-10-CM | POA: Diagnosis not present

## 2020-09-19 DIAGNOSIS — I700 Atherosclerosis of aorta: Secondary | ICD-10-CM | POA: Diagnosis not present

## 2020-09-19 DIAGNOSIS — E538 Deficiency of other specified B group vitamins: Secondary | ICD-10-CM | POA: Diagnosis not present

## 2020-10-04 DIAGNOSIS — E782 Mixed hyperlipidemia: Secondary | ICD-10-CM | POA: Diagnosis not present

## 2020-10-04 DIAGNOSIS — M1711 Unilateral primary osteoarthritis, right knee: Secondary | ICD-10-CM | POA: Diagnosis not present

## 2020-10-04 DIAGNOSIS — J40 Bronchitis, not specified as acute or chronic: Secondary | ICD-10-CM | POA: Diagnosis not present

## 2021-01-14 DIAGNOSIS — I25119 Atherosclerotic heart disease of native coronary artery with unspecified angina pectoris: Secondary | ICD-10-CM | POA: Diagnosis not present

## 2021-01-14 DIAGNOSIS — R5382 Chronic fatigue, unspecified: Secondary | ICD-10-CM | POA: Diagnosis not present

## 2021-01-14 DIAGNOSIS — K5909 Other constipation: Secondary | ICD-10-CM | POA: Diagnosis not present

## 2021-02-27 DIAGNOSIS — H40003 Preglaucoma, unspecified, bilateral: Secondary | ICD-10-CM | POA: Diagnosis not present

## 2021-04-01 DIAGNOSIS — E538 Deficiency of other specified B group vitamins: Secondary | ICD-10-CM | POA: Diagnosis not present

## 2021-04-01 DIAGNOSIS — E782 Mixed hyperlipidemia: Secondary | ICD-10-CM | POA: Diagnosis not present

## 2021-04-01 DIAGNOSIS — R739 Hyperglycemia, unspecified: Secondary | ICD-10-CM | POA: Diagnosis not present

## 2021-04-08 DIAGNOSIS — M50122 Cervical disc disorder at C5-C6 level with radiculopathy: Secondary | ICD-10-CM | POA: Diagnosis not present

## 2021-04-08 DIAGNOSIS — E538 Deficiency of other specified B group vitamins: Secondary | ICD-10-CM | POA: Diagnosis not present

## 2021-04-08 DIAGNOSIS — Z0000 Encounter for general adult medical examination without abnormal findings: Secondary | ICD-10-CM | POA: Diagnosis not present

## 2021-04-08 DIAGNOSIS — I700 Atherosclerosis of aorta: Secondary | ICD-10-CM | POA: Diagnosis not present

## 2021-04-08 DIAGNOSIS — E782 Mixed hyperlipidemia: Secondary | ICD-10-CM | POA: Diagnosis not present

## 2021-04-08 DIAGNOSIS — M5010 Cervical disc disorder with radiculopathy, unspecified cervical region: Secondary | ICD-10-CM | POA: Diagnosis not present

## 2021-04-08 DIAGNOSIS — M50123 Cervical disc disorder at C6-C7 level with radiculopathy: Secondary | ICD-10-CM | POA: Diagnosis not present

## 2021-04-22 DIAGNOSIS — M5010 Cervical disc disorder with radiculopathy, unspecified cervical region: Secondary | ICD-10-CM | POA: Diagnosis not present

## 2021-04-22 DIAGNOSIS — M5116 Intervertebral disc disorders with radiculopathy, lumbar region: Secondary | ICD-10-CM | POA: Diagnosis not present

## 2021-05-27 DIAGNOSIS — J209 Acute bronchitis, unspecified: Secondary | ICD-10-CM | POA: Diagnosis not present

## 2021-05-27 DIAGNOSIS — Z03818 Encounter for observation for suspected exposure to other biological agents ruled out: Secondary | ICD-10-CM | POA: Diagnosis not present

## 2021-08-29 DIAGNOSIS — H40003 Preglaucoma, unspecified, bilateral: Secondary | ICD-10-CM | POA: Diagnosis not present

## 2021-09-30 DIAGNOSIS — H2511 Age-related nuclear cataract, right eye: Secondary | ICD-10-CM | POA: Diagnosis not present

## 2021-09-30 DIAGNOSIS — E782 Mixed hyperlipidemia: Secondary | ICD-10-CM | POA: Diagnosis not present

## 2021-10-07 DIAGNOSIS — R0609 Other forms of dyspnea: Secondary | ICD-10-CM | POA: Diagnosis not present

## 2021-10-07 DIAGNOSIS — Z0000 Encounter for general adult medical examination without abnormal findings: Secondary | ICD-10-CM | POA: Diagnosis not present

## 2021-10-07 DIAGNOSIS — E538 Deficiency of other specified B group vitamins: Secondary | ICD-10-CM | POA: Diagnosis not present

## 2021-10-07 DIAGNOSIS — R739 Hyperglycemia, unspecified: Secondary | ICD-10-CM | POA: Diagnosis not present

## 2021-10-07 DIAGNOSIS — I700 Atherosclerosis of aorta: Secondary | ICD-10-CM | POA: Diagnosis not present

## 2021-10-07 DIAGNOSIS — E782 Mixed hyperlipidemia: Secondary | ICD-10-CM | POA: Diagnosis not present

## 2021-10-07 DIAGNOSIS — R0600 Dyspnea, unspecified: Secondary | ICD-10-CM | POA: Diagnosis not present

## 2021-10-08 ENCOUNTER — Encounter: Payer: Self-pay | Admitting: Ophthalmology

## 2021-10-13 NOTE — Discharge Instructions (Signed)

## 2021-10-15 ENCOUNTER — Encounter
Admission: RE | Disposition: A | Discharge status: Home / Self Care | Payer: Self-pay | Source: Ambulatory Visit | Attending: Ophthalmology

## 2021-10-15 ENCOUNTER — Ambulatory Visit: Payer: PPO | Admitting: Anesthesiology

## 2021-10-15 ENCOUNTER — Ambulatory Visit
Admission: RE | Admit: 2021-10-15 | Discharge: 2021-10-15 | Disposition: A | Discharge status: Home / Self Care | Payer: PPO | Source: Ambulatory Visit | Attending: Ophthalmology | Admitting: Ophthalmology

## 2021-10-15 ENCOUNTER — Other Ambulatory Visit: Payer: Self-pay

## 2021-10-15 ENCOUNTER — Encounter: Payer: Self-pay | Admitting: Ophthalmology

## 2021-10-15 DIAGNOSIS — Z87891 Personal history of nicotine dependence: Secondary | ICD-10-CM | POA: Insufficient documentation

## 2021-10-15 DIAGNOSIS — H2511 Age-related nuclear cataract, right eye: Secondary | ICD-10-CM | POA: Diagnosis not present

## 2021-10-15 DIAGNOSIS — I10 Essential (primary) hypertension: Secondary | ICD-10-CM | POA: Insufficient documentation

## 2021-10-15 DIAGNOSIS — H25811 Combined forms of age-related cataract, right eye: Secondary | ICD-10-CM | POA: Diagnosis not present

## 2021-10-15 DIAGNOSIS — Z79899 Other long term (current) drug therapy: Secondary | ICD-10-CM | POA: Insufficient documentation

## 2021-10-15 DIAGNOSIS — Z6841 Body Mass Index (BMI) 40.0 and over, adult: Secondary | ICD-10-CM | POA: Insufficient documentation

## 2021-10-15 DIAGNOSIS — J449 Chronic obstructive pulmonary disease, unspecified: Secondary | ICD-10-CM | POA: Insufficient documentation

## 2021-10-15 HISTORY — PX: CATARACT EXTRACTION W/PHACO: SHX586

## 2021-10-15 SURGERY — PHACOEMULSIFICATION, CATARACT, WITH IOL INSERTION
Anesthesia: Monitor Anesthesia Care | Site: Eye | Laterality: Right

## 2021-10-15 MED ORDER — MIDAZOLAM HCL 2 MG/2ML IJ SOLN
INTRAMUSCULAR | Status: DC | PRN
Start: 2021-10-15 — End: 2021-10-15
  Administered 2021-10-15: 2 mg via INTRAVENOUS

## 2021-10-15 MED ORDER — FENTANYL CITRATE (PF) 100 MCG/2ML IJ SOLN
INTRAMUSCULAR | Status: DC | PRN
Start: 2021-10-15 — End: 2021-10-15
  Administered 2021-10-15 (×2): 50 ug via INTRAVENOUS

## 2021-10-15 MED ORDER — SIGHTPATH DOSE#1 BSS IO SOLN
INTRAOCULAR | Status: DC | PRN
  Administered 2021-10-15: 15 mL

## 2021-10-15 MED ORDER — TETRACAINE HCL 0.5 % OP SOLN
1.0000 [drp] | OPHTHALMIC | Status: DC | PRN
  Administered 2021-10-15 (×3): 1 [drp] via OPHTHALMIC

## 2021-10-15 MED ORDER — CEFUROXIME OPHTHALMIC INJECTION 1 MG/0.1 ML
INJECTION | OPHTHALMIC | Status: DC | PRN
  Administered 2021-10-15: 1 mg via OPHTHALMIC

## 2021-10-15 MED ORDER — ARMC OPHTHALMIC DILATING DROPS
1.0000 "application " | OPHTHALMIC | Status: DC | PRN
  Administered 2021-10-15 (×3): 1 via OPHTHALMIC

## 2021-10-15 MED ORDER — SIGHTPATH DOSE#1 NA HYALUR & NA CHOND-NA HYALUR IO KIT
PACK | INTRAOCULAR | Status: DC | PRN
  Administered 2021-10-15: 1 via OPHTHALMIC

## 2021-10-15 MED ORDER — ACETAMINOPHEN 160 MG/5ML PO SOLN: 325.0000 mg | Freq: Once | ORAL | Status: DC

## 2021-10-15 MED ORDER — ACETAMINOPHEN 325 MG PO TABS: 325.0000 mg | ORAL_TABLET | Freq: Once | ORAL | Status: DC

## 2021-10-15 MED ORDER — BRIMONIDINE TARTRATE-TIMOLOL 0.2-0.5 % OP SOLN
OPHTHALMIC | Status: DC | PRN
  Administered 2021-10-15: 1 [drp] via OPHTHALMIC

## 2021-10-15 MED ORDER — SIGHTPATH DOSE#1 BSS IO SOLN
INTRAOCULAR | Status: DC | PRN
  Administered 2021-10-15: 79 mL via OPHTHALMIC

## 2021-10-15 MED ORDER — LACTATED RINGERS IV SOLN: INTRAVENOUS | Status: DC

## 2021-10-15 MED ORDER — SIGHTPATH DOSE#1 BSS IO SOLN
INTRAOCULAR | Status: DC | PRN
  Administered 2021-10-15: 1 mL via INTRAMUSCULAR

## 2021-10-15 SURGICAL SUPPLY — 11 items
CATARACT SUITE SIGHTPATH (MISCELLANEOUS) ×2 IMPLANT
FEE CATARACT SUITE SIGHTPATH (MISCELLANEOUS) ×1 IMPLANT
GLOVE SRG 8 PF TXTR STRL LF DI (GLOVE) ×1 IMPLANT
GLOVE SURG ENC TEXT LTX SZ7.5 (GLOVE) ×2 IMPLANT
GLOVE SURG UNDER POLY LF SZ8 (GLOVE) ×2
LENS IOL TECNIS EYHANCE 20.5 (Intraocular Lens) ×1 IMPLANT
NDL FILTER BLUNT 18X1 1/2 (NEEDLE) ×1 IMPLANT
NEEDLE FILTER BLUNT 18X 1/2SAF (NEEDLE) ×1
NEEDLE FILTER BLUNT 18X1 1/2 (NEEDLE) ×1 IMPLANT
SYR 3ML LL SCALE MARK (SYRINGE) ×2 IMPLANT
WATER STERILE IRR 250ML POUR (IV SOLUTION) ×2 IMPLANT

## 2021-10-15 NOTE — Op Note (Signed)
LOCATION:  [facility] ?  ?PREOPERATIVE DIAGNOSIS:    Nuclear sclerotic cataract right eye. H25.11 ?  ?POSTOPERATIVE DIAGNOSIS:  Nuclear sclerotic cataract right eye.   ?  ?PROCEDURE:  Phacoemusification with posterior chamber intraocular lens placement of the right eye  ? ?ULTRASOUND TIME: Procedure(s) with comments: ?CATARACT EXTRACTION PHACO AND INTRAOCULAR LENS PLACEMENT (IOC) RIGHT (Right) - 7.64 ?01:05.2 ? ?LENS:   ?Implant Name Type Inv. Item Serial No. Manufacturer Lot No. LRB No. Used Action  ?LENS IOL TECNIS EYHANCE 20.5 - [id number] Intraocular Lens LENS IOL TECNIS EYHANCE 20.5 [id number] [employer]  Right 1 Implanted  ?   ?  ?  ?SURGEON:  Wyonia Hough, MD ?  ?ANESTHESIA:  Topical with tetracaine drops and 2% Xylocaine jelly, augmented with 1% preservative-free intracameral lidocaine. ? ?  ?COMPLICATIONS:  None. ?  ?DESCRIPTION OF PROCEDURE:  The patient was identified in the holding room and transported to the operating room and placed in the supine position under the operating microscope.  The right eye was identified as the operative eye and it was prepped and draped in the usual sterile ophthalmic fashion. ?  ?A 1 millimeter clear-corneal paracentesis was made at the 12:00 position.  0.5 ml of preservative-free 1% lidocaine was injected into the anterior chamber. ?The anterior chamber was filled with Viscoat viscoelastic.  A 2.4 millimeter keratome was used to make a near-clear corneal incision at the 9:00 position.  A curvilinear capsulorrhexis was made with a cystotome and capsulorrhexis forceps.  Balanced salt solution was used to hydrodissect and hydrodelineate the nucleus. ?  ?Phacoemulsification was then used in stop and chop fashion to remove the lens nucleus and epinucleus.  The remaining cortex was then removed using the irrigation and aspiration handpiece. Provisc was then placed into the capsular bag to distend it for lens placement.  A lens was then injected into the  capsular bag.  The remaining viscoelastic was aspirated. ?  ?Wounds were hydrated with balanced salt solution.  The anterior chamber was inflated to a physiologic pressure with balanced salt solution.  No wound leaks were noted. Cefuroxime 0.1 ml of a '10mg'$ /ml solution was injected into the anterior chamber for a dose of 1 mg of intracameral antibiotic at the completion of the case. ?  Timolol and Brimonidine drops were applied to the eye.  The patient was taken to the recovery room in stable condition without complications of anesthesia or surgery. ? ? ?Polk Minor ?10/15/2021, 12:31 PM ? ?

## 2021-10-15 NOTE — Anesthesia Preprocedure Evaluation (Signed)
Anesthesia Evaluation  ?Patient identified by MRN, date of birth, ID band ?Patient awake ? ? ? ?Reviewed: ?Allergy & Precautions, H&P , NPO status , Patient's Chart, lab work & pertinent test results ? ?Airway ?Mallampati: II ? ?TM Distance: >3 FB ?Neck ROM: full ? ? ? Dental ?no notable dental hx. ? ?  ?Pulmonary ?COPD, former smoker,  ?  ?Pulmonary exam normal ?breath sounds clear to auscultation ? ? ? ? ? ? Cardiovascular ?hypertension, Normal cardiovascular exam ?Rhythm:regular Rate:Normal ? ? ?  ?Neuro/Psych ?  ? GI/Hepatic ?  ?Endo/Other  ?Morbid obesity ? Renal/GU ?  ? ?  ?Musculoskeletal ? ? Abdominal ?  ?Peds ? Hematology ?  ?Anesthesia Other Findings ? ? Reproductive/Obstetrics ? ?  ? ? ? ? ? ? ? ? ? ? ? ? ? ?  ?  ? ? ? ? ? ? ? ? ?Anesthesia Physical ?Anesthesia Plan ? ?ASA: 3 ? ?Anesthesia Plan: MAC  ? ?Post-op Pain Management: Minimal or no pain anticipated  ? ?Induction:  ? ?PONV Risk Score and Plan: 2 and Treatment may vary due to age or medical condition and TIVA ? ?Airway Management Planned:  ? ?Additional Equipment:  ? ?Intra-op Plan:  ? ?Post-operative Plan:  ? ?Informed Consent: I have reviewed the patients History and Physical, chart, labs and discussed the procedure including the risks, benefits and alternatives for the proposed anesthesia with the patient or authorized representative who has indicated his/her understanding and acceptance.  ? ? ? ?Dental Advisory Given ? ?Plan Discussed with: CRNA ? ?Anesthesia Plan Comments:   ? ? ? ? ? ? ?Anesthesia Quick Evaluation ? ?

## 2021-10-15 NOTE — Anesthesia Postprocedure Evaluation (Signed)
Anesthesia Post Note ? ?Patient: Christina Nielsen ? ?Procedure(s) Performed: CATARACT EXTRACTION PHACO AND INTRAOCULAR LENS PLACEMENT (IOC) RIGHT (Right: Eye) ? ? ?  ?Patient location during evaluation: PACU ?Anesthesia Type: MAC ?Level of consciousness: awake and alert and oriented ?Pain management: satisfactory to patient ?Vital Signs Assessment: post-procedure vital signs reviewed and stable ?Respiratory status: spontaneous breathing, nonlabored ventilation and respiratory function stable ?Cardiovascular status: blood pressure returned to baseline and stable ?Postop Assessment: Adequate PO intake and No signs of nausea or vomiting ?Anesthetic complications: no ? ? ?No notable events documented. ? ?Raliegh Ip ? ? ? ? ? ?

## 2021-10-15 NOTE — Transfer of Care (Signed)
Immediate Anesthesia Transfer of Care Note ? ?Patient: Christina Nielsen ? ?Procedure(s) Performed: CATARACT EXTRACTION PHACO AND INTRAOCULAR LENS PLACEMENT (IOC) RIGHT (Right: Eye) ? ?Patient Location: PACU ? ?Anesthesia Type: MAC ? ?Level of Consciousness: awake, alert  and patient cooperative ? ?Airway and Oxygen Therapy: Patient Spontanous Breathing and Patient connected to supplemental oxygen ? ?Post-op Assessment: Post-op Vital signs reviewed, Patient's Cardiovascular Status Stable, Respiratory Function Stable, Patent Airway and No signs of Nausea or vomiting ? ?Post-op Vital Signs: Reviewed and stable ? ?Complications: No notable events documented. ? ?

## 2021-10-15 NOTE — Anesthesia Procedure Notes (Signed)
Procedure Name: [place] ?Date/Time: 10/15/2021 12:17 PM ?Performed by: Jeannene Patella, CRNA ?Pre-anesthesia Checklist: Patient identified, Emergency Drugs available, Suction available, Timeout performed and Patient being monitored ?Patient Re-evaluated:Patient Re-evaluated prior to induction ?Oxygen Delivery Method: Nasal cannula ?Placement Confirmation: positive ETCO2 ? ? ? ? ?

## 2021-10-15 NOTE — H&P (Signed)
?[facility]  ? ?Primary Care Physician:  Rusty Aus, MD ?Ophthalmologist: Dr. Leandrew Koyanagi ? ?Pre-Procedure History & Physical: ?HPI:  Christina PFLIEGER is a 71 y.o. female here for ophthalmic surgery. ?  ?Past Medical History:  ?Diagnosis Date  ? Angina at rest [facility])   ? Back pain   ? Basal cell carcinoma 11/2015  ? nose  ? COPD (chronic obstructive pulmonary disease) ([place])   ? Hodgkin's lymphoma ([place])   ? Hypertension   ? Pt denies  ? Non Hodgkin's lymphoma ([place])   ? ? ?Past Surgical History:  ?Procedure Laterality Date  ? CATARACT EXTRACTION W/PHACO Left 02/23/2018  ? Procedure: CATARACT EXTRACTION PHACO AND INTRAOCULAR LENS PLACEMENT ([place]) LEFT;  Surgeon: Leandrew Koyanagi, MD;  Location: [facility];  Service: Ophthalmology;  Laterality: Left;  ? MOHS SURGERY    ? PORTACATH PLACEMENT  2000  ? non-functional. never removed.  ? ? ?Prior to Admission medications   ?Medication Sig Start Date End Date Taking? Authorizing Provider  ?aspirin EC 81 MG tablet Take 81 mg by mouth daily.   Yes [provider]  ?atorvastatin (LIPITOR) 10 MG tablet Take 1 tablet (10 mg total) by mouth daily. 09/20/18 10/08/21 Yes Gollan, Kathlene November, MD  ?diclofenac (VOLTAREN) 75 MG EC tablet Take 75 mg by mouth 2 (two) times daily.   Yes [provider]  ?latanoprost (XALATAN) 0.005 % ophthalmic solution Place 1 drop into both eyes at bedtime.   Yes [provider]  ?montelukast (SINGULAIR) 10 MG tablet Take 10 mg by mouth daily.   Yes [provider]  ?olmesartan-hydrochlorothiazide (BENICAR HCT) 20-12.5 MG tablet Take 1 tablet by mouth daily.   Yes [provider]  ?BIOTIN PO Take by mouth daily. ?Patient not taking: Reported on 10/08/2021    [provider]  ?Cyanocobalamin (VITAMIN B-12 PO) Take by mouth daily. ?Patient not taking: Reported on 10/08/2021    [provider]  ?Multiple Vitamin (MULTIVITAMIN) capsule Take 1 capsule by mouth daily. ?Patient  not taking: Reported on 10/08/2021    [provider]  ?Potassium 99 MG TABS Take by mouth daily. ?Patient not taking: Reported on 10/08/2021    [provider]  ? ? ?Allergies as of 09/01/2021  ? (No Known Allergies)  ? ? ?Family History  ?Problem Relation Age of Onset  ? Heart failure Father   ? Atrial fibrillation Sister   ? Asthma Mother   ? Atrial fibrillation Brother   ? ? ?Social History  ? ?Socioeconomic History  ? Marital status: Married  ?  Spouse name: Not on file  ? Number of children: Not on file  ? Years of education: Not on file  ? Highest education level: Not on file  ?Occupational History  ? Not on file  ?Tobacco Use  ? Smoking status: Former  ?  Types: Cigarettes  ?  Quit date: 05/08/2021  ?  Years since quitting: 0.4  ? Smokeless tobacco: Never  ? Tobacco comments:  ?  quit 05/2015. Smoked again for about 1 year, quit 11/22.  ?Vaping Use  ? Vaping Use: Never used  ?Substance and Sexual Activity  ? Alcohol use: No  ? Drug use: No  ? Sexual activity: Not on file  ?Other Topics Concern  ? Not on file  ?Social History Narrative  ? Not on file  ? ?Social Determinants of Health  ? ?Financial Resource Strain: Not on file  ?Food Insecurity: Not on file  ?Transportation Needs:  Not on file  ?Physical Activity: Not on file  ?Stress: Not on file  ?Social Connections: Not on file  ?Intimate Partner Violence: Not on file  ? ? ?Review of Systems: ?See HPI, otherwise negative ROS ? ?Physical Exam: ?BP (!) 178/86   Pulse 70   Temp (!) 97.5 ?F (36.4 ?C) (Temporal)   Resp 18   Ht '5\' 2"'$  (1.575 m)   Wt 111.6 kg   SpO2 98%   BMI 44.99 kg/m?  ?General:   Alert,  pleasant and cooperative in NAD ?Head:  Normocephalic and atraumatic. ?Lungs:  Clear to auscultation.    ?Heart:  Regular rate and rhythm.  ? ?Impression/Plan: ?Christina Nielsen is here for ophthalmic surgery. ? ?Risks, benefits, limitations, and alternatives regarding ophthalmic surgery have been reviewed with the patient.  Questions have  been answered.  All parties agreeable. ? ? Leandrew Koyanagi, MD  10/15/2021, 11:51 AM ? ?

## 2021-10-16 ENCOUNTER — Encounter: Payer: Self-pay | Admitting: Ophthalmology

## 2021-11-03 DIAGNOSIS — R0609 Other forms of dyspnea: Secondary | ICD-10-CM | POA: Diagnosis not present

## 2022-04-15 DIAGNOSIS — E538 Deficiency of other specified B group vitamins: Secondary | ICD-10-CM | POA: Diagnosis not present

## 2022-04-15 DIAGNOSIS — E782 Mixed hyperlipidemia: Secondary | ICD-10-CM | POA: Diagnosis not present

## 2022-04-15 DIAGNOSIS — R739 Hyperglycemia, unspecified: Secondary | ICD-10-CM | POA: Diagnosis not present

## 2022-04-22 DIAGNOSIS — G959 Disease of spinal cord, unspecified: Secondary | ICD-10-CM | POA: Diagnosis not present

## 2022-04-22 DIAGNOSIS — R739 Hyperglycemia, unspecified: Secondary | ICD-10-CM | POA: Diagnosis not present

## 2022-04-22 DIAGNOSIS — M48062 Spinal stenosis, lumbar region with neurogenic claudication: Secondary | ICD-10-CM | POA: Diagnosis not present

## 2022-04-22 DIAGNOSIS — N179 Acute kidney failure, unspecified: Secondary | ICD-10-CM | POA: Diagnosis not present

## 2022-04-22 DIAGNOSIS — Z0000 Encounter for general adult medical examination without abnormal findings: Secondary | ICD-10-CM | POA: Diagnosis not present

## 2022-04-22 DIAGNOSIS — M4316 Spondylolisthesis, lumbar region: Secondary | ICD-10-CM | POA: Diagnosis not present

## 2022-04-22 DIAGNOSIS — E538 Deficiency of other specified B group vitamins: Secondary | ICD-10-CM | POA: Diagnosis not present

## 2022-04-22 DIAGNOSIS — E782 Mixed hyperlipidemia: Secondary | ICD-10-CM | POA: Diagnosis not present

## 2022-04-27 ENCOUNTER — Other Ambulatory Visit: Payer: Self-pay | Admitting: Internal Medicine

## 2022-04-27 DIAGNOSIS — G959 Disease of spinal cord, unspecified: Secondary | ICD-10-CM

## 2022-04-27 DIAGNOSIS — Z0000 Encounter for general adult medical examination without abnormal findings: Secondary | ICD-10-CM

## 2022-05-12 ENCOUNTER — Ambulatory Visit
Admission: RE | Admit: 2022-05-12 | Discharge: 2022-05-12 | Disposition: A | Discharge status: Home / Self Care | Payer: PPO | Source: Ambulatory Visit | Attending: Internal Medicine | Admitting: Internal Medicine

## 2022-05-12 DIAGNOSIS — Z0000 Encounter for general adult medical examination without abnormal findings: Secondary | ICD-10-CM | POA: Diagnosis not present

## 2022-05-12 DIAGNOSIS — M48061 Spinal stenosis, lumbar region without neurogenic claudication: Secondary | ICD-10-CM | POA: Diagnosis not present

## 2022-05-12 DIAGNOSIS — G959 Disease of spinal cord, unspecified: Secondary | ICD-10-CM | POA: Insufficient documentation

## 2022-05-12 DIAGNOSIS — M4317 Spondylolisthesis, lumbosacral region: Secondary | ICD-10-CM | POA: Diagnosis not present

## 2022-05-18 DIAGNOSIS — N179 Acute kidney failure, unspecified: Secondary | ICD-10-CM | POA: Diagnosis not present

## 2022-05-22 DIAGNOSIS — E538 Deficiency of other specified B group vitamins: Secondary | ICD-10-CM | POA: Diagnosis not present

## 2022-05-22 DIAGNOSIS — M519 Unspecified thoracic, thoracolumbar and lumbosacral intervertebral disc disorder: Secondary | ICD-10-CM | POA: Diagnosis not present

## 2022-07-07 DIAGNOSIS — M48062 Spinal stenosis, lumbar region with neurogenic claudication: Secondary | ICD-10-CM | POA: Diagnosis not present

## 2022-07-07 DIAGNOSIS — M5136 Other intervertebral disc degeneration, lumbar region: Secondary | ICD-10-CM | POA: Diagnosis not present

## 2022-07-07 DIAGNOSIS — M4316 Spondylolisthesis, lumbar region: Secondary | ICD-10-CM | POA: Diagnosis not present

## 2022-07-07 DIAGNOSIS — M4317 Spondylolisthesis, lumbosacral region: Secondary | ICD-10-CM | POA: Diagnosis not present

## 2022-07-09 ENCOUNTER — Other Ambulatory Visit: Payer: Self-pay | Admitting: Internal Medicine

## 2022-07-09 ENCOUNTER — Ambulatory Visit
Admission: RE | Admit: 2022-07-09 | Discharge: 2022-07-09 | Disposition: A | Discharge status: Home / Self Care | Payer: PPO | Source: Ambulatory Visit | Attending: Internal Medicine | Admitting: Internal Medicine

## 2022-07-09 DIAGNOSIS — R0602 Shortness of breath: Secondary | ICD-10-CM | POA: Diagnosis not present

## 2022-07-09 DIAGNOSIS — E538 Deficiency of other specified B group vitamins: Secondary | ICD-10-CM | POA: Diagnosis not present

## 2022-07-09 DIAGNOSIS — I200 Unstable angina: Secondary | ICD-10-CM | POA: Diagnosis not present

## 2022-07-09 DIAGNOSIS — G959 Disease of spinal cord, unspecified: Secondary | ICD-10-CM | POA: Diagnosis not present

## 2022-07-09 DIAGNOSIS — I700 Atherosclerosis of aorta: Secondary | ICD-10-CM | POA: Diagnosis not present

## 2022-07-09 DIAGNOSIS — R079 Chest pain, unspecified: Secondary | ICD-10-CM | POA: Diagnosis not present

## 2022-07-09 LAB — POCT I-STAT CREATININE: Creatinine, Ser: 1.2 mg/dL — ABNORMAL HIGH (ref 0.44–1.00)

## 2022-07-09 MED ORDER — IOHEXOL 350 MG/ML SOLN
75.0000 mL | Freq: Once | INTRAVENOUS | Status: AC | PRN
  Administered 2022-07-09: 75 mL via INTRAVENOUS

## 2022-07-16 DIAGNOSIS — E782 Mixed hyperlipidemia: Secondary | ICD-10-CM | POA: Diagnosis not present

## 2022-07-16 DIAGNOSIS — Z01818 Encounter for other preprocedural examination: Secondary | ICD-10-CM | POA: Diagnosis not present

## 2022-07-16 DIAGNOSIS — R0602 Shortness of breath: Secondary | ICD-10-CM | POA: Diagnosis not present

## 2022-07-16 DIAGNOSIS — Z87891 Personal history of nicotine dependence: Secondary | ICD-10-CM | POA: Diagnosis not present

## 2022-07-16 DIAGNOSIS — I700 Atherosclerosis of aorta: Secondary | ICD-10-CM | POA: Diagnosis not present

## 2022-07-24 DIAGNOSIS — H40003 Preglaucoma, unspecified, bilateral: Secondary | ICD-10-CM | POA: Diagnosis not present

## 2022-07-24 DIAGNOSIS — Z961 Presence of intraocular lens: Secondary | ICD-10-CM | POA: Diagnosis not present

## 2022-07-24 DIAGNOSIS — H26492 Other secondary cataract, left eye: Secondary | ICD-10-CM | POA: Diagnosis not present

## 2022-07-27 DIAGNOSIS — R0602 Shortness of breath: Secondary | ICD-10-CM | POA: Diagnosis not present

## 2022-08-10 DIAGNOSIS — E538 Deficiency of other specified B group vitamins: Secondary | ICD-10-CM | POA: Diagnosis not present

## 2022-08-11 ENCOUNTER — Other Ambulatory Visit: Payer: Self-pay

## 2022-08-11 ENCOUNTER — Other Ambulatory Visit: Payer: Self-pay | Admitting: Internal Medicine

## 2022-08-11 DIAGNOSIS — R0602 Shortness of breath: Secondary | ICD-10-CM | POA: Diagnosis not present

## 2022-08-11 DIAGNOSIS — R0609 Other forms of dyspnea: Secondary | ICD-10-CM | POA: Diagnosis not present

## 2022-08-11 DIAGNOSIS — I200 Unstable angina: Secondary | ICD-10-CM | POA: Diagnosis not present

## 2022-08-11 DIAGNOSIS — Z8572 Personal history of non-Hodgkin lymphomas: Secondary | ICD-10-CM | POA: Diagnosis not present

## 2022-08-11 DIAGNOSIS — Z87891 Personal history of nicotine dependence: Secondary | ICD-10-CM | POA: Diagnosis not present

## 2022-08-11 DIAGNOSIS — I700 Atherosclerosis of aorta: Secondary | ICD-10-CM

## 2022-08-11 DIAGNOSIS — Z01818 Encounter for other preprocedural examination: Secondary | ICD-10-CM | POA: Diagnosis not present

## 2022-08-11 DIAGNOSIS — E782 Mixed hyperlipidemia: Secondary | ICD-10-CM | POA: Diagnosis not present

## 2022-08-11 DIAGNOSIS — I429 Cardiomyopathy, unspecified: Secondary | ICD-10-CM | POA: Diagnosis not present

## 2022-08-12 ENCOUNTER — Telehealth (HOSPITAL_COMMUNITY): Payer: Self-pay | Admitting: *Deleted

## 2022-08-12 ENCOUNTER — Encounter (HOSPITAL_COMMUNITY): Payer: Self-pay

## 2022-08-12 MED ORDER — METOPROLOL TARTRATE 100 MG PO TABS: ORAL_TABLET | ORAL | 0 refills | Status: DC

## 2022-08-12 MED ORDER — IVABRADINE HCL 5 MG PO TABS: ORAL_TABLET | ORAL | 0 refills | Status: DC

## 2022-08-12 NOTE — Telephone Encounter (Signed)
Reaching out to patient to offer assistance regarding upcoming cardiac imaging study; pt verbalizes understanding of appt date/time, parking situation and where to check in, pre-test NPO status and medications ordered, and verified current allergies; name and call back number provided for further questions should they arise  Gordy Clement RN [facility] [phone] office [phone] cell  Patient confirms her HR is 76bpm this morning. She is to take '100mg'$  metoprolol tartrate and '10mg'$  ivabradine two hours prior to her cardiac CT scan.

## 2022-08-13 ENCOUNTER — Ambulatory Visit
Admission: RE | Admit: 2022-08-13 | Discharge: 2022-08-13 | Disposition: A | Discharge status: Home / Self Care | Payer: PPO | Source: Ambulatory Visit | Attending: Internal Medicine | Admitting: Internal Medicine

## 2022-08-13 DIAGNOSIS — I700 Atherosclerosis of aorta: Secondary | ICD-10-CM | POA: Insufficient documentation

## 2022-08-13 MED ORDER — NITROGLYCERIN 0.4 MG SL SUBL
0.8000 mg | SUBLINGUAL_TABLET | Freq: Once | SUBLINGUAL | Status: AC
  Administered 2022-08-13: 0.8 mg via SUBLINGUAL

## 2022-08-13 MED ORDER — IOHEXOL 350 MG/ML SOLN
100.0000 mL | Freq: Once | INTRAVENOUS | Status: AC | PRN
  Administered 2022-08-13: 100 mL via INTRAVENOUS

## 2022-08-13 NOTE — Progress Notes (Signed)
Patient tolerated CT well. Drank water after. Vital signs stable encourage to drink water throughout day.Reasons explained and verbalized understanding. Ambulated steady gait.   

## 2022-08-19 DIAGNOSIS — R0602 Shortness of breath: Secondary | ICD-10-CM | POA: Diagnosis not present

## 2022-08-19 DIAGNOSIS — I5022 Chronic systolic (congestive) heart failure: Secondary | ICD-10-CM | POA: Diagnosis not present

## 2022-08-19 DIAGNOSIS — E782 Mixed hyperlipidemia: Secondary | ICD-10-CM | POA: Diagnosis not present

## 2022-08-19 DIAGNOSIS — I2510 Atherosclerotic heart disease of native coronary artery without angina pectoris: Secondary | ICD-10-CM | POA: Diagnosis not present

## 2022-08-19 DIAGNOSIS — Z87891 Personal history of nicotine dependence: Secondary | ICD-10-CM | POA: Diagnosis not present

## 2022-08-19 DIAGNOSIS — I429 Cardiomyopathy, unspecified: Secondary | ICD-10-CM | POA: Diagnosis not present

## 2022-09-04 DIAGNOSIS — R0602 Shortness of breath: Secondary | ICD-10-CM | POA: Diagnosis not present

## 2022-09-04 DIAGNOSIS — Z87891 Personal history of nicotine dependence: Secondary | ICD-10-CM | POA: Diagnosis not present

## 2022-09-09 ENCOUNTER — Other Ambulatory Visit: Payer: Self-pay | Admitting: *Deleted

## 2022-09-09 DIAGNOSIS — Z87891 Personal history of nicotine dependence: Secondary | ICD-10-CM

## 2022-09-09 DIAGNOSIS — Z122 Encounter for screening for malignant neoplasm of respiratory organs: Secondary | ICD-10-CM

## 2022-09-15 DIAGNOSIS — G250 Essential tremor: Secondary | ICD-10-CM | POA: Diagnosis not present

## 2022-09-15 DIAGNOSIS — E538 Deficiency of other specified B group vitamins: Secondary | ICD-10-CM | POA: Diagnosis not present

## 2022-09-15 DIAGNOSIS — R0789 Other chest pain: Secondary | ICD-10-CM | POA: Diagnosis not present

## 2022-09-15 DIAGNOSIS — J431 Panlobular emphysema: Secondary | ICD-10-CM | POA: Diagnosis not present

## 2022-09-18 ENCOUNTER — Encounter: Payer: Self-pay | Admitting: Acute Care

## 2022-09-18 ENCOUNTER — Ambulatory Visit (INDEPENDENT_AMBULATORY_CARE_PROVIDER_SITE_OTHER): Payer: PPO | Admitting: Acute Care

## 2022-09-18 DIAGNOSIS — Z87891 Personal history of nicotine dependence: Secondary | ICD-10-CM

## 2022-09-18 NOTE — Progress Notes (Signed)
Virtual Visit via Telephone Note  I connected with Christina Nielsen on 09/18/22 at  9:30 AM EDT by telephone and verified that I am speaking with the correct person using two identifiers.  Location: Patient:  At home Provider: [id number], [place], [place], [address]    I discussed the limitations, risks, security and privacy concerns of performing an evaluation and management service by telephone and the availability of in person appointments. I also discussed with the patient that there may be a patient responsible charge related to this service. The patient expressed understanding and agreed to proceed.   Shared Decision Making Visit Lung Cancer Screening Program [phone]([id number])   Eligibility: Age 72 y.o. Pack Years Smoking History Calculation 45 pack year smoking history (# packs/per year x # years smoked) Recent History of coughing up blood  no Unexplained weight loss? no ( >Than 15 pounds within the last 6 months ) Prior History Lung / other cancer no (Diagnosis within the last 5 years already requiring surveillance chest CT Scans). Smoking Status Former Smoker Former Smokers: Years since quit: 8 years  Quit Date: 2016  Visit Components: Discussion included one or more decision making aids. yes Discussion included risk/benefits of screening. yes Discussion included potential follow up diagnostic testing for abnormal scans. yes Discussion included meaning and risk of over diagnosis. yes Discussion included meaning and risk of False Positives. yes Discussion included meaning of total radiation exposure. yes  Counseling Included: Importance of adherence to annual lung cancer LDCT screening. yes Impact of comorbidities on ability to participate in the program. yes Ability and willingness to under diagnostic treatment. yes  Smoking Cessation Counseling: Current Smokers:  Discussed importance of smoking cessation. yes Information about tobacco cessation classes and  interventions provided to patient. yes Patient provided with "ticket" for LDCT Scan. yes Symptomatic Patient. no  Counseling NA Diagnosis Code: Tobacco Use Z72.0 Asymptomatic Patient yes  Counseling (Intermediate counseling: > three minutes counseling) [id number] Former Smokers:  Discussed the importance of maintaining cigarette abstinence. yes Diagnosis Code: Personal History of Nicotine Dependence. V78.469Z87.891 Information about tobacco cessation classes and interventions provided to patient. Yes Patient provided with "ticket" for LDCT Scan. yes Written Order for Lung Cancer Screening with LDCT placed in Epic. Yes (CT Chest Lung Cancer Screening Low Dose W/O CM) [id number] Z12.2-Screening of respiratory organs Z87.891-Personal history of nicotine dependence  I spent 25 minutes of face to face time/virtual visit time  with  Christina Nielsen discussing the risks and benefits of lung cancer screening. We took the time to pause the power point at intervals to allow for questions to be asked and answered to ensure understanding. We discussed that she had taken the single most powerful action possible to decrease her risk of developing lung cancer when she quit smoking. I counseled her to remain smoke free, and to contact me if she ever had the desire to smoke again so that I can provide resources and tools to help support the effort to remain smoke free. We discussed the time and location of the scan, and that either  Abigail Miyamotoenise Phelps RN, Karlton Lemonenise Buckner, RN or I  or I will call / send a letter with the results within  24-72 hours of receiving them. She has the office contact information in the event she needs to speak with me,  she verbalized understanding of all of the above and had no further questions upon leaving the office.     I explained to the patient that there has  been a high incidence of coronary artery disease noted on these exams. I explained that this is a non-gated exam therefore degree or severity cannot  be determined. This patient is on statin therapy. I have asked the patient to follow-up with their PCP regarding any incidental finding of coronary artery disease and management with diet or medication as they feel is clinically indicated. The patient verbalized understanding of the above and had no further questions.     Bevelyn Ngo, NP 09/18/2022

## 2022-09-18 NOTE — Patient Instructions (Signed)
Thank you for participating in the  Lung Cancer Screening Program. It was our pleasure to meet you today. We will call you with the results of your scan within the next few days. Your scan will be assigned a Lung RADS category score by the physicians reading the scans.  This Lung RADS score determines follow up scanning.  See below for description of categories, and follow up screening recommendations. We will be in touch to schedule your follow up screening annually or based on recommendations of our providers. We will fax a copy of your scan results to your Primary Care Physician, or the physician who referred you to the program, to ensure they have the results. Please call the office if you have any questions or concerns regarding your scanning experience or results.  Our office number is [phone]. Please speak with Denise Phelps, RN. , or  Denise Buckner RN, They are  our Lung Cancer Screening RN.'s If They are unavailable when you call, Please leave a message on the voice mail. We will return your call at our earliest convenience.This voice mail is monitored several times a day.  Remember, if your scan is normal, we will scan you annually as long as you continue to meet the criteria for the program. (Age 50-80, Current smoker or smoker who has quit within the last 15 years). If you are a smoker, remember, quitting is the single most powerful action that you can take to decrease your risk of lung cancer and other pulmonary, breathing related problems. We know quitting is hard, and we are here to help.  Please let us know if there is anything we can do to help you meet your goal of quitting. If you are a former smoker, congratulations. We are proud of you! Remain smoke free! Remember you can refer friends or family members through the number above.  We will screen them to make sure they meet criteria for the program. Thank you for helping us take better care of you by  participating in Lung Screening.  You can receive free nicotine replacement therapy ( patches, gum or mints) by calling 1-800-QUIT NOW. Please call so we can get you on the path to becoming  a non-smoker. I know it is hard, but you can do this!  Lung RADS Categories:  Lung RADS 1: no nodules or definitely non-concerning nodules.  Recommendation is for a repeat annual scan in 12 months.  Lung RADS 2:  nodules that are non-concerning in appearance and behavior with a very low likelihood of becoming an active cancer. Recommendation is for a repeat annual scan in 12 months.  Lung RADS 3: nodules that are probably non-concerning , includes nodules with a low likelihood of becoming an active cancer.  Recommendation is for a 6-month repeat screening scan. Often noted after an upper respiratory illness. We will be in touch to make sure you have no questions, and to schedule your 6-month scan.  Lung RADS 4 A: nodules with concerning findings, recommendation is most often for a follow up scan in 3 months or additional testing based on our provider's assessment of the scan. We will be in touch to make sure you have no questions and to schedule the recommended 3 month follow up scan.  Lung RADS 4 B:  indicates findings that are concerning. We will be in touch with you to schedule additional diagnostic testing based on our provider's  assessment of the scan.  Other options for assistance in smoking cessation (   As covered by your insurance benefits)  Hypnosis for smoking cessation  [employer] [phone]  Acupuncture for smoking cessation  [facility] [phone]   

## 2022-09-21 ENCOUNTER — Ambulatory Visit
Admission: RE | Admit: 2022-09-21 | Discharge: 2022-09-21 | Disposition: A | Discharge status: Home / Self Care | Payer: PPO | Source: Ambulatory Visit | Attending: Acute Care | Admitting: Acute Care

## 2022-09-21 DIAGNOSIS — Z122 Encounter for screening for malignant neoplasm of respiratory organs: Secondary | ICD-10-CM | POA: Insufficient documentation

## 2022-09-21 DIAGNOSIS — Z87891 Personal history of nicotine dependence: Secondary | ICD-10-CM | POA: Diagnosis not present

## 2022-09-22 ENCOUNTER — Other Ambulatory Visit: Payer: Self-pay

## 2022-09-22 DIAGNOSIS — Z87891 Personal history of nicotine dependence: Secondary | ICD-10-CM

## 2022-09-22 DIAGNOSIS — Z122 Encounter for screening for malignant neoplasm of respiratory organs: Secondary | ICD-10-CM

## 2022-10-27 DIAGNOSIS — E782 Mixed hyperlipidemia: Secondary | ICD-10-CM | POA: Diagnosis not present

## 2022-10-27 DIAGNOSIS — R739 Hyperglycemia, unspecified: Secondary | ICD-10-CM | POA: Diagnosis not present

## 2022-10-27 DIAGNOSIS — E538 Deficiency of other specified B group vitamins: Secondary | ICD-10-CM | POA: Diagnosis not present

## 2022-11-03 DIAGNOSIS — Z0000 Encounter for general adult medical examination without abnormal findings: Secondary | ICD-10-CM | POA: Diagnosis not present

## 2022-11-03 DIAGNOSIS — N1831 Chronic kidney disease, stage 3a: Secondary | ICD-10-CM | POA: Diagnosis not present

## 2022-11-03 DIAGNOSIS — J431 Panlobular emphysema: Secondary | ICD-10-CM | POA: Diagnosis not present

## 2022-11-03 DIAGNOSIS — E782 Mixed hyperlipidemia: Secondary | ICD-10-CM | POA: Diagnosis not present

## 2022-11-03 DIAGNOSIS — E538 Deficiency of other specified B group vitamins: Secondary | ICD-10-CM | POA: Diagnosis not present

## 2022-11-03 DIAGNOSIS — R739 Hyperglycemia, unspecified: Secondary | ICD-10-CM | POA: Diagnosis not present

## 2022-12-02 DIAGNOSIS — R911 Solitary pulmonary nodule: Secondary | ICD-10-CM | POA: Diagnosis not present

## 2022-12-02 DIAGNOSIS — R0602 Shortness of breath: Secondary | ICD-10-CM | POA: Diagnosis not present

## 2022-12-03 DIAGNOSIS — E782 Mixed hyperlipidemia: Secondary | ICD-10-CM | POA: Diagnosis not present

## 2022-12-03 DIAGNOSIS — I2510 Atherosclerotic heart disease of native coronary artery without angina pectoris: Secondary | ICD-10-CM | POA: Diagnosis not present

## 2022-12-03 DIAGNOSIS — K219 Gastro-esophageal reflux disease without esophagitis: Secondary | ICD-10-CM | POA: Diagnosis not present

## 2022-12-03 DIAGNOSIS — R001 Bradycardia, unspecified: Secondary | ICD-10-CM | POA: Diagnosis not present

## 2022-12-03 DIAGNOSIS — I429 Cardiomyopathy, unspecified: Secondary | ICD-10-CM | POA: Diagnosis not present

## 2022-12-03 DIAGNOSIS — Z87891 Personal history of nicotine dependence: Secondary | ICD-10-CM | POA: Diagnosis not present

## 2022-12-03 DIAGNOSIS — R0602 Shortness of breath: Secondary | ICD-10-CM | POA: Diagnosis not present

## 2022-12-03 DIAGNOSIS — I5022 Chronic systolic (congestive) heart failure: Secondary | ICD-10-CM | POA: Diagnosis not present

## 2022-12-08 DIAGNOSIS — N1831 Chronic kidney disease, stage 3a: Secondary | ICD-10-CM | POA: Diagnosis not present

## 2022-12-08 DIAGNOSIS — E538 Deficiency of other specified B group vitamins: Secondary | ICD-10-CM | POA: Diagnosis not present

## 2022-12-09 ENCOUNTER — Other Ambulatory Visit: Payer: Self-pay | Admitting: Internal Medicine

## 2022-12-09 DIAGNOSIS — N289 Disorder of kidney and ureter, unspecified: Secondary | ICD-10-CM

## 2022-12-15 ENCOUNTER — Ambulatory Visit: Payer: PPO

## 2022-12-31 ENCOUNTER — Ambulatory Visit
Admission: RE | Admit: 2022-12-31 | Discharge: 2022-12-31 | Disposition: A | Discharge status: Home / Self Care | Payer: PPO | Source: Ambulatory Visit | Attending: Internal Medicine | Admitting: Internal Medicine

## 2022-12-31 DIAGNOSIS — N289 Disorder of kidney and ureter, unspecified: Secondary | ICD-10-CM | POA: Diagnosis not present

## 2022-12-31 DIAGNOSIS — N281 Cyst of kidney, acquired: Secondary | ICD-10-CM | POA: Insufficient documentation

## 2023-01-19 ENCOUNTER — Other Ambulatory Visit: Payer: PPO

## 2023-01-29 DIAGNOSIS — H40003 Preglaucoma, unspecified, bilateral: Secondary | ICD-10-CM | POA: Diagnosis not present

## 2023-01-29 DIAGNOSIS — Z961 Presence of intraocular lens: Secondary | ICD-10-CM | POA: Diagnosis not present

## 2023-01-29 DIAGNOSIS — H26492 Other secondary cataract, left eye: Secondary | ICD-10-CM | POA: Diagnosis not present

## 2023-04-16 DIAGNOSIS — H26492 Other secondary cataract, left eye: Secondary | ICD-10-CM | POA: Diagnosis not present

## 2023-05-07 DIAGNOSIS — E538 Deficiency of other specified B group vitamins: Secondary | ICD-10-CM | POA: Diagnosis not present

## 2023-05-07 DIAGNOSIS — H43812 Vitreous degeneration, left eye: Secondary | ICD-10-CM | POA: Diagnosis not present

## 2023-05-07 DIAGNOSIS — R739 Hyperglycemia, unspecified: Secondary | ICD-10-CM | POA: Diagnosis not present

## 2023-05-07 DIAGNOSIS — E782 Mixed hyperlipidemia: Secondary | ICD-10-CM | POA: Diagnosis not present

## 2023-05-07 DIAGNOSIS — N1831 Chronic kidney disease, stage 3a: Secondary | ICD-10-CM | POA: Diagnosis not present

## 2023-05-11 DIAGNOSIS — M519 Unspecified thoracic, thoracolumbar and lumbosacral intervertebral disc disorder: Secondary | ICD-10-CM | POA: Diagnosis not present

## 2023-05-11 DIAGNOSIS — Z1231 Encounter for screening mammogram for malignant neoplasm of breast: Secondary | ICD-10-CM | POA: Diagnosis not present

## 2023-05-11 DIAGNOSIS — Z0000 Encounter for general adult medical examination without abnormal findings: Secondary | ICD-10-CM | POA: Diagnosis not present

## 2023-05-11 DIAGNOSIS — M48062 Spinal stenosis, lumbar region with neurogenic claudication: Secondary | ICD-10-CM | POA: Diagnosis not present

## 2023-05-11 DIAGNOSIS — N184 Chronic kidney disease, stage 4 (severe): Secondary | ICD-10-CM | POA: Diagnosis not present

## 2023-07-01 DIAGNOSIS — N184 Chronic kidney disease, stage 4 (severe): Secondary | ICD-10-CM | POA: Diagnosis not present

## 2023-07-01 DIAGNOSIS — I429 Cardiomyopathy, unspecified: Secondary | ICD-10-CM | POA: Diagnosis not present

## 2023-07-01 DIAGNOSIS — R011 Cardiac murmur, unspecified: Secondary | ICD-10-CM | POA: Diagnosis not present

## 2023-07-01 DIAGNOSIS — E782 Mixed hyperlipidemia: Secondary | ICD-10-CM | POA: Diagnosis not present

## 2023-07-01 DIAGNOSIS — Z8572 Personal history of non-Hodgkin lymphomas: Secondary | ICD-10-CM | POA: Diagnosis not present

## 2023-07-01 DIAGNOSIS — I5022 Chronic systolic (congestive) heart failure: Secondary | ICD-10-CM | POA: Diagnosis not present

## 2023-07-01 DIAGNOSIS — R001 Bradycardia, unspecified: Secondary | ICD-10-CM | POA: Diagnosis not present

## 2023-07-01 DIAGNOSIS — J439 Emphysema, unspecified: Secondary | ICD-10-CM | POA: Diagnosis not present

## 2023-07-01 DIAGNOSIS — Z87891 Personal history of nicotine dependence: Secondary | ICD-10-CM | POA: Diagnosis not present

## 2023-07-01 DIAGNOSIS — K219 Gastro-esophageal reflux disease without esophagitis: Secondary | ICD-10-CM | POA: Diagnosis not present

## 2023-07-01 DIAGNOSIS — R0602 Shortness of breath: Secondary | ICD-10-CM | POA: Diagnosis not present

## 2023-07-15 DIAGNOSIS — R011 Cardiac murmur, unspecified: Secondary | ICD-10-CM | POA: Diagnosis not present

## 2023-08-09 DIAGNOSIS — N184 Chronic kidney disease, stage 4 (severe): Secondary | ICD-10-CM | POA: Diagnosis not present

## 2023-08-11 DIAGNOSIS — R739 Hyperglycemia, unspecified: Secondary | ICD-10-CM | POA: Diagnosis not present

## 2023-08-11 DIAGNOSIS — M519 Unspecified thoracic, thoracolumbar and lumbosacral intervertebral disc disorder: Secondary | ICD-10-CM | POA: Diagnosis not present

## 2023-08-11 DIAGNOSIS — E782 Mixed hyperlipidemia: Secondary | ICD-10-CM | POA: Diagnosis not present

## 2023-08-11 DIAGNOSIS — N184 Chronic kidney disease, stage 4 (severe): Secondary | ICD-10-CM | POA: Diagnosis not present

## 2023-08-11 DIAGNOSIS — I351 Nonrheumatic aortic (valve) insufficiency: Secondary | ICD-10-CM | POA: Diagnosis not present

## 2023-08-11 DIAGNOSIS — G959 Disease of spinal cord, unspecified: Secondary | ICD-10-CM | POA: Diagnosis not present

## 2023-09-30 DIAGNOSIS — J439 Emphysema, unspecified: Secondary | ICD-10-CM | POA: Diagnosis not present

## 2023-09-30 DIAGNOSIS — R011 Cardiac murmur, unspecified: Secondary | ICD-10-CM | POA: Diagnosis not present

## 2023-09-30 DIAGNOSIS — R001 Bradycardia, unspecified: Secondary | ICD-10-CM | POA: Diagnosis not present

## 2023-09-30 DIAGNOSIS — I351 Nonrheumatic aortic (valve) insufficiency: Secondary | ICD-10-CM | POA: Diagnosis not present

## 2023-09-30 DIAGNOSIS — I2510 Atherosclerotic heart disease of native coronary artery without angina pectoris: Secondary | ICD-10-CM | POA: Diagnosis not present

## 2023-09-30 DIAGNOSIS — I429 Cardiomyopathy, unspecified: Secondary | ICD-10-CM | POA: Diagnosis not present

## 2023-09-30 DIAGNOSIS — K219 Gastro-esophageal reflux disease without esophagitis: Secondary | ICD-10-CM | POA: Diagnosis not present

## 2023-09-30 DIAGNOSIS — E782 Mixed hyperlipidemia: Secondary | ICD-10-CM | POA: Diagnosis not present

## 2023-09-30 DIAGNOSIS — I700 Atherosclerosis of aorta: Secondary | ICD-10-CM | POA: Diagnosis not present

## 2023-09-30 DIAGNOSIS — I5022 Chronic systolic (congestive) heart failure: Secondary | ICD-10-CM | POA: Diagnosis not present

## 2023-09-30 DIAGNOSIS — N184 Chronic kidney disease, stage 4 (severe): Secondary | ICD-10-CM | POA: Diagnosis not present

## 2023-10-04 ENCOUNTER — Other Ambulatory Visit: Payer: Self-pay | Admitting: Acute Care

## 2023-10-04 DIAGNOSIS — Z87891 Personal history of nicotine dependence: Secondary | ICD-10-CM

## 2023-10-11 ENCOUNTER — Ambulatory Visit

## 2023-10-14 ENCOUNTER — Ambulatory Visit
Admission: RE | Admit: 2023-10-14 | Discharge: 2023-10-14 | Disposition: A | Discharge status: Home / Self Care | Source: Ambulatory Visit | Attending: Acute Care | Admitting: Acute Care

## 2023-10-14 DIAGNOSIS — Z87891 Personal history of nicotine dependence: Secondary | ICD-10-CM | POA: Diagnosis not present

## 2023-10-14 DIAGNOSIS — F17210 Nicotine dependence, cigarettes, uncomplicated: Secondary | ICD-10-CM | POA: Diagnosis not present

## 2023-10-21 DIAGNOSIS — I351 Nonrheumatic aortic (valve) insufficiency: Secondary | ICD-10-CM

## 2023-10-25 ENCOUNTER — Encounter
Admission: RE | Disposition: A | Discharge status: Home / Self Care | Payer: Self-pay | Source: Home / Self Care | Attending: Cardiovascular Disease

## 2023-10-25 ENCOUNTER — Other Ambulatory Visit: Payer: Self-pay

## 2023-10-25 ENCOUNTER — Ambulatory Visit
Admission: RE | Admit: 2023-10-25 | Discharge: 2023-10-25 | Disposition: A | Discharge status: Home / Self Care | Attending: Cardiovascular Disease | Admitting: Cardiovascular Disease

## 2023-10-25 ENCOUNTER — Ambulatory Visit
Admission: RE | Admit: 2023-10-25 | Discharge: 2023-10-25 | Disposition: A | Discharge status: Home / Self Care | Source: Home / Self Care | Attending: Student | Admitting: Cardiovascular Disease

## 2023-10-25 ENCOUNTER — Ambulatory Visit: Admitting: Certified Registered Nurse Anesthetist

## 2023-10-25 ENCOUNTER — Encounter: Payer: Self-pay | Admitting: Cardiovascular Disease

## 2023-10-25 DIAGNOSIS — J449 Chronic obstructive pulmonary disease, unspecified: Secondary | ICD-10-CM | POA: Diagnosis not present

## 2023-10-25 DIAGNOSIS — I083 Combined rheumatic disorders of mitral, aortic and tricuspid valves: Secondary | ICD-10-CM | POA: Diagnosis not present

## 2023-10-25 DIAGNOSIS — Z87891 Personal history of nicotine dependence: Secondary | ICD-10-CM | POA: Insufficient documentation

## 2023-10-25 DIAGNOSIS — I2510 Atherosclerotic heart disease of native coronary artery without angina pectoris: Secondary | ICD-10-CM | POA: Diagnosis not present

## 2023-10-25 DIAGNOSIS — I10 Essential (primary) hypertension: Secondary | ICD-10-CM | POA: Diagnosis not present

## 2023-10-25 DIAGNOSIS — I351 Nonrheumatic aortic (valve) insufficiency: Secondary | ICD-10-CM

## 2023-10-25 HISTORY — PX: TEE WITHOUT CARDIOVERSION: SHX5443

## 2023-10-25 LAB — ECHO TEE

## 2023-10-25 SURGERY — ECHOCARDIOGRAM, TRANSESOPHAGEAL
Anesthesia: General

## 2023-10-25 MED ORDER — LIDOCAINE VISCOUS HCL 2 % MT SOLN
OROMUCOSAL | Status: AC
  Filled 2023-10-25: qty 15

## 2023-10-25 MED ORDER — PROPOFOL 10 MG/ML IV BOLUS
INTRAVENOUS | Status: DC | PRN
  Administered 2023-10-25: 80 mg via INTRAVENOUS
  Administered 2023-10-25: 20 mg via INTRAVENOUS
  Administered 2023-10-25: 30 mg via INTRAVENOUS
  Administered 2023-10-25: 20 mg via INTRAVENOUS

## 2023-10-25 MED ORDER — BUTAMBEN-TETRACAINE-BENZOCAINE 2-2-14 % EX AERO
INHALATION_SPRAY | CUTANEOUS | Status: AC
  Filled 2023-10-25: qty 5

## 2023-10-25 MED ORDER — PROPOFOL 1000 MG/100ML IV EMUL
INTRAVENOUS | Status: AC
  Filled 2023-10-25: qty 100

## 2023-10-25 MED ORDER — SODIUM CHLORIDE 0.9 % IV SOLN: INTRAVENOUS | Status: DC

## 2023-10-25 NOTE — H&P (Signed)
 Pre-procedure History & Physical    Patient ID: Christina Nielsen MRN: [id number]; DOB: 06-25-1950   Date of procedure: 10/25/2023  Primary Care Provider: Sari Cunning, MD Primary Cardiologist: None   Planned procedure:  TEE  HPI:   Christina Nielsen is a 73 y.o. 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 noted on echo, here for TEE to better characterize valvular lesion  Past Medical History:  Diagnosis Date   Angina at rest [facility])    Back pain    Basal cell carcinoma 11/2015   nose   COPD (chronic obstructive pulmonary disease) (HCC)    Hodgkin's lymphoma (HCC)    Hypertension    Pt denies   Non Hodgkin's lymphoma [facility])     Past Surgical History:  Procedure Laterality Date   CATARACT EXTRACTION W/PHACO Left 02/23/2018   Procedure: CATARACT EXTRACTION PHACO AND INTRAOCULAR LENS PLACEMENT (IOC) LEFT;  Surgeon: Annell Kidney, MD;  Location[facility];  Service: Ophthalmology;  Laterality: Left;   CATARACT EXTRACTION W/PHACO Right 10/15/2021   Procedure: CATARACT EXTRACTION PHACO AND INTRAOCULAR LENS PLACEMENT (IOC) RIGHT;  Surgeon: Annell Kidney, MD;  Location: [facility];  Service: Ophthalmology;  Laterality: Right;  7.64 01:05.2   MOHS SURGERY     PORTACATH PLACEMENT  2000   non-functional. never removed.     Medications Prior to Admission: Prior to Admission medications   Medication Sig Start Date End Date Taking? Authorizing Provider  acetaminophen  (TYLENOL ) 650 MG CR tablet Take 650 mg by mouth 2 (two) times daily.   Yes [provider]  amLODipine (NORVASC) 5 MG tablet Take 5 mg by mouth at bedtime. 08/11/23 08/10/24 Yes [provider]  aspirin  EC 81 MG tablet Take 81 mg by mouth daily.   Yes [provider]  atorvastatin  (LIPITOR) 10 MG tablet Take 1 tablet (10 mg total) by mouth daily. 09/20/18 10/25/23 Yes Gollan, Timothy J, MD  BIOTIN PO Take 2 tablets by mouth daily. Hair skin and nails gummy   Yes [provider]   empagliflozin (JARDIANCE) 10 MG TABS tablet Take 10 mg by mouth every other day.   Yes [provider]  latanoprost (XALATAN) 0.005 % ophthalmic solution Place 1 drop into both eyes at bedtime.   Yes [provider]  levothyroxine (SYNTHROID) 75 MCG tablet Take 75 mcg by mouth daily before breakfast. 11/03/22 11/03/23 Yes [provider]  Multiple Vitamin (MULTIVITAMIN) capsule Take 1 capsule by mouth daily. Woman 50+   Yes [provider]  pantoprazole (PROTONIX) 40 MG tablet Take 40 mg by mouth daily. 07/19/23  Yes [provider]  propranolol (INDERAL) 40 MG tablet Take 40 mg by mouth daily. 08/02/23  Yes [provider]  traMADol (ULTRAM) 50 MG tablet Take 100 mg by mouth 2 (two) times daily.   Yes [provider]  Gaylan Kaufman 200-62.5-25 MCG/ACT AEPB Inhale 1 puff into the lungs daily. 12/02/22  Yes [provider]     Allergies:    Allergies  Allergen Reactions   Olmesartan Other (See Comments)    Renal insufficiency    Social History:   Social History   Socioeconomic History   Marital status: Widowed    Spouse name: Not on file   Number of children: Not on file   Years of education: Not on file   Highest education level: Not on file  Occupational History   Not on file  Tobacco Use   Smoking status: Former    Current  packs/day: 0.00    Types: Cigarettes    Quit date: 05/08/2021    Years since quitting: 2.4   Smokeless tobacco: Never   Tobacco comments:    quit 05/2015. Smoked again for about 1 year, quit 11/22.  Vaping Use   Vaping status: Never Used  Substance and Sexual Activity   Alcohol use: No   Drug use: No   Sexual activity: Not on file  Other Topics Concern   Not on file  Social History Narrative   Not on file   Social Drivers of Health   Financial Resource Strain: Low Risk  (05/06/2023)   Received from [facility]   Overall Financial Resource Strain (CARDIA)     Difficulty of Paying Living Expenses: Not very hard  Food Insecurity: No Food Insecurity (05/06/2023)   Received from [facility]   Hunger Vital Sign    Worried About Running Out of Food in the Last Year: Never true    Ran Out of Food in the Last Year: Never true  Transportation Needs: No Transportation Needs (05/06/2023)   Received from [facility]    In the past 12 months, has lack of transportation kept you from medical appointments or from getting medications?: No    Lack of Transportation (Non-Medical): No  Physical Activity: Not on file  Stress: Not on file  Social Connections: Not on file  Intimate Partner Violence: Not on file     Family History:   The patient's family history includes Asthma in her mother; Atrial fibrillation in her brother and sister; Heart failure in her father.    ROS:  Please see the history of present illness.  All other ROS reviewed and negative.     Physical Exam/Data:   Vitals:   10/25/23 1130  BP: (!) 158/74  Pulse: 69  Resp: 15  Temp: 98.1 F (36.7 C)  TempSrc: Oral  SpO2: 97%  Weight: 104.8 kg  Height: 5\' 3"  (1.6 m)   No intake or output data in the 24 hours ending 10/25/23 1143    10/25/2023   11:30 AM 10/15/2021   11:36 AM 10/08/2021   12:20 PM  Last 3 Weights  Weight (lbs) 231 lb 246 lb 246 lb  Weight (kg) 104.781 kg 111.585 kg 111.585 kg     Body mass index is 40.92 kg/m.  Wt Readings from Last 3 Encounters:  10/25/23 104.8 kg  10/15/21 111.6 kg  02/23/18 120.7 kg    Physical Exam: General: no acute distress. Head: Normocephalic, atraumatic  Neck: supple Lungs: nl effort Heart: RRR, +murmur Abdomen: Soft Msk:  Strength and tone appear normal for age. Extremities: warm, dry Neuro: awake and alert Psych:  Responds to questions appropriately with a normal affect.    Laboratory Data:  ChemistryNo results for input(s): "NA", "K", "CL", "CO2",  "GLUCOSE", "BUN", "CREATININE", "CALCIUM ", "GFRNONAA", "GFRAA", "ANIONGAP" in the last 168 hours.  No results for input(s): "PROT", "ALBUMIN", "AST", "ALT", "ALKPHOS", "BILITOT" in the last 168 hours. HematologyNo results for input(s): "WBC", "RBC", "HGB", "HCT", "MCV", "MCH", "MCHC", "RDW", "PLT" in the last 168 hours.  Assessment and Plan   Will proceed w/ TEE  ASA III  Anesthesia to be administered by CRNA  Signed, Dorita Garter, MD 10/25/2023, 11:43 AM

## 2023-10-25 NOTE — Progress Notes (Signed)
 Transesophageal echocardiogram preliminary report  [name] [id number] 08-Jun-1951  Preliminary diagnosis Symptomatic valvular heart disease  Postprocedural diagnosis AR, mild to moderate  Time out A timeout was performed by the nursing staff and physicians specifically identifying the procedure performed, identification of the patient, the type of sedation, all allergies and medications, all pertinent medical history, and presedation assessment of nasopharynx.  The patient and or family understand the risks of the procedure including the rare risks of death, stroke, heart attack, esophogeal perforation, sore throat, and reaction to medications given.  General anesthesia CRNA administered GA. See MAR.  150 mg propofol. The patient had continued monitoring of heart rate, oxygenation, blood pressure, respiratory rate, and extent of signs of sedation throughout the entire procedure.  The patient received anesthesia over a period of 20 minutes.  CRNA, nursing staff and I were present during the procedure when the patient was anesthetized for 100% of the time.  Treatment considerations Follow up with primary cardiologist  For further details of transesophageal echocardiogram please refer to final report.  Christina Nielsen [facility] MD [facility] 10/25/2023 12:46 PM

## 2023-10-25 NOTE — Anesthesia Preprocedure Evaluation (Signed)
 Anesthesia Evaluation  Patient identified by MRN, date of birth, ID band Patient awake    Reviewed: Allergy & Precautions, H&P , NPO status , Patient's Chart, lab work & pertinent test results  Airway Mallampati: II  TM Distance: >3 FB Neck ROM: full    Dental no notable dental hx. (+) Chipped   Pulmonary neg pulmonary ROS, COPD, former smoker   Pulmonary exam normal breath sounds clear to auscultation       Cardiovascular hypertension, + CAD  (-) dysrhythmias  Rhythm:regular Rate:Normal     Neuro/Psych negative neurological ROS  negative psych ROS   GI/Hepatic negative GI ROS, Neg liver ROS,,,  Endo/Other  negative endocrine ROS  Class 3 obesity  Renal/GU negative Renal ROS  negative genitourinary   Musculoskeletal   Abdominal   Peds  Hematology negative hematology ROS (+)   Anesthesia Other Findings Past Medical History: No date: Angina at rest [facility]) No date: Back pain 11/2015: Basal cell carcinoma     Comment:  nose No date: COPD (chronic obstructive pulmonary disease) (HCC) No date: Hodgkin's lymphoma (HCC) No date: Hypertension     Comment:  Pt denies No date: Non Hodgkin's lymphoma (HCC)  Past Surgical History: 02/23/2018: CATARACT EXTRACTION W/PHACO; Left     Comment:  Procedure: CATARACT EXTRACTION PHACO AND INTRAOCULAR               LENS PLACEMENT (IOC) LEFT;  Surgeon: Annell Kidney, MD;  Location: [facility];  Service:               Ophthalmology;  Laterality: Left; 10/15/2021: CATARACT EXTRACTION W/PHACO; Right     Comment:  Procedure: CATARACT EXTRACTION PHACO AND INTRAOCULAR               LENS PLACEMENT (IOC) RIGHT;  Surgeon: Annell Kidney, MD;  Location: [facility];  Service:               Ophthalmology;  Laterality: Right;  7.64 01:05.2 No date: MOHS SURGERY 2000: PORTACATH PLACEMENT     Comment:  non-functional. never  removed.  BMI    Body Mass Index: 40.92 kg/m      Reproductive/Obstetrics negative OB ROS                             Anesthesia Physical Anesthesia Plan  ASA: 3  Anesthesia Plan: General   Post-op Pain Management: Minimal or no pain anticipated   Induction: Intravenous  PONV Risk Score and Plan: 3 and Propofol infusion, TIVA and Ondansetron   Airway Management Planned: Nasal Cannula  Additional Equipment: None  Intra-op Plan:   Post-operative Plan:   Informed Consent: I have reviewed the patients History and Physical, chart, labs and discussed the procedure including the risks, benefits and alternatives for the proposed anesthesia with the patient or authorized representative who has indicated his/her understanding and acceptance.     Dental advisory given  Plan Discussed with: CRNA and Surgeon  Anesthesia Plan Comments: (Discussed risks of anesthesia with patient, including possibility of difficulty with spontaneous ventilation under anesthesia necessitating airway intervention, PONV, and rare risks such as cardiac or respiratory or neurological events, and allergic reactions. Discussed the role of CRNA in patient's perioperative care. Patient understands.)       Anesthesia Quick Evaluation

## 2023-10-25 NOTE — Progress Notes (Signed)
*  PRELIMINARY RESULTS* Echocardiogram Echocardiogram Transesophageal has been performed.  Christina Nielsen 10/25/2023, 1:30 PM

## 2023-10-25 NOTE — Transfer of Care (Signed)
 Immediate Anesthesia Transfer of Care Note  Patient: Christina Nielsen  Procedure(s) Performed: ECHOCARDIOGRAM, TRANSESOPHAGEAL  Patient Location: PACU and Nursing Unit  Anesthesia Type:General  Level of Consciousness: drowsy and patient cooperative  Airway & Oxygen Therapy: Patient Spontanous Breathing and Patient connected to nasal cannula oxygen  Post-op Assessment: Report given to RN and Post -op Vital signs reviewed and stable  Post vital signs: Reviewed and stable  Last Vitals:  Vitals Value Taken Time  BP 96/58 10/25/23 1251  Temp    Pulse 54 10/25/23 1251  Resp 17 10/25/23 1251  SpO2 96 % 10/25/23 1251  Vitals shown include unfiled device data.  Last Pain:  Vitals:   10/25/23 1130  TempSrc: Oral  PainSc: 0-No pain         Complications: No notable events documented.

## 2023-10-26 ENCOUNTER — Encounter: Payer: Self-pay | Admitting: Cardiovascular Disease

## 2023-10-26 NOTE — Anesthesia Postprocedure Evaluation (Signed)
 Anesthesia Post Note  Patient: LAENA LICK  Procedure(s) Performed: ECHOCARDIOGRAM, TRANSESOPHAGEAL  Patient location during evaluation: PACU Anesthesia Type: General Level of consciousness: awake and alert Pain management: pain level controlled Vital Signs Assessment: post-procedure vital signs reviewed and stable Respiratory status: spontaneous breathing, nonlabored ventilation, respiratory function stable and patient connected to nasal cannula oxygen Cardiovascular status: blood pressure returned to baseline and stable Postop Assessment: no apparent nausea or vomiting Anesthetic complications: no  No notable events documented.   Last Vitals:  Vitals:   10/25/23 1330 10/25/23 1345  BP: (!) 127/59 134/61  Pulse: (!) 53 (!) 58  Resp: 20 17  Temp:    SpO2: 97% 96%    Last Pain:  Vitals:   10/25/23 1345  TempSrc:   PainSc: 0-No pain                 Enrique Harvest

## 2023-11-10 ENCOUNTER — Other Ambulatory Visit: Payer: Self-pay

## 2023-11-10 DIAGNOSIS — F17210 Nicotine dependence, cigarettes, uncomplicated: Secondary | ICD-10-CM

## 2023-11-10 DIAGNOSIS — Z122 Encounter for screening for malignant neoplasm of respiratory organs: Secondary | ICD-10-CM

## 2023-11-10 DIAGNOSIS — Z87891 Personal history of nicotine dependence: Secondary | ICD-10-CM

## 2023-11-12 DIAGNOSIS — Z1211 Encounter for screening for malignant neoplasm of colon: Secondary | ICD-10-CM | POA: Diagnosis not present

## 2023-11-12 DIAGNOSIS — Z1212 Encounter for screening for malignant neoplasm of rectum: Secondary | ICD-10-CM | POA: Diagnosis not present

## 2023-12-07 DIAGNOSIS — Z961 Presence of intraocular lens: Secondary | ICD-10-CM | POA: Diagnosis not present

## 2023-12-07 DIAGNOSIS — H40003 Preglaucoma, unspecified, bilateral: Secondary | ICD-10-CM | POA: Diagnosis not present

## 2023-12-07 DIAGNOSIS — H43812 Vitreous degeneration, left eye: Secondary | ICD-10-CM | POA: Diagnosis not present

## 2023-12-08 DIAGNOSIS — E782 Mixed hyperlipidemia: Secondary | ICD-10-CM | POA: Diagnosis not present

## 2023-12-08 DIAGNOSIS — R739 Hyperglycemia, unspecified: Secondary | ICD-10-CM | POA: Diagnosis not present

## 2023-12-15 DIAGNOSIS — J432 Centrilobular emphysema: Secondary | ICD-10-CM | POA: Diagnosis not present

## 2023-12-15 DIAGNOSIS — R739 Hyperglycemia, unspecified: Secondary | ICD-10-CM | POA: Diagnosis not present

## 2023-12-15 DIAGNOSIS — Z0000 Encounter for general adult medical examination without abnormal findings: Secondary | ICD-10-CM | POA: Diagnosis not present

## 2023-12-15 DIAGNOSIS — I351 Nonrheumatic aortic (valve) insufficiency: Secondary | ICD-10-CM | POA: Diagnosis not present

## 2023-12-15 DIAGNOSIS — E782 Mixed hyperlipidemia: Secondary | ICD-10-CM | POA: Diagnosis not present

## 2024-03-01 DIAGNOSIS — I351 Nonrheumatic aortic (valve) insufficiency: Secondary | ICD-10-CM | POA: Diagnosis not present

## 2024-03-01 DIAGNOSIS — M4145 Neuromuscular scoliosis, thoracolumbar region: Secondary | ICD-10-CM | POA: Diagnosis not present

## 2024-03-01 DIAGNOSIS — M5414 Radiculopathy, thoracic region: Secondary | ICD-10-CM | POA: Diagnosis not present

## 2024-04-13 DIAGNOSIS — E782 Mixed hyperlipidemia: Secondary | ICD-10-CM | POA: Diagnosis not present

## 2024-04-17 DIAGNOSIS — Z0000 Encounter for general adult medical examination without abnormal findings: Secondary | ICD-10-CM | POA: Diagnosis not present

## 2024-04-17 DIAGNOSIS — M5116 Intervertebral disc disorders with radiculopathy, lumbar region: Secondary | ICD-10-CM | POA: Diagnosis not present

## 2024-04-17 DIAGNOSIS — J439 Emphysema, unspecified: Secondary | ICD-10-CM | POA: Diagnosis not present

## 2024-04-17 DIAGNOSIS — E538 Deficiency of other specified B group vitamins: Secondary | ICD-10-CM | POA: Diagnosis not present

## 2024-04-17 DIAGNOSIS — E782 Mixed hyperlipidemia: Secondary | ICD-10-CM | POA: Diagnosis not present

## 2024-04-17 DIAGNOSIS — M4145 Neuromuscular scoliosis, thoracolumbar region: Secondary | ICD-10-CM | POA: Diagnosis not present

## 2024-04-17 DIAGNOSIS — R739 Hyperglycemia, unspecified: Secondary | ICD-10-CM | POA: Diagnosis not present

## 2024-05-16 DIAGNOSIS — Z2821 Immunization not carried out because of patient refusal: Secondary | ICD-10-CM | POA: Diagnosis not present
